# Patient Record
Sex: Female | Born: 1970 | Race: White | Hispanic: No | Marital: Married | Smoking: Never smoker
Health system: Southern US, Community
[De-identification: ages and names within clinical notes are randomized; demographics above are authoritative.]

## PROBLEM LIST (undated history)

## (undated) DIAGNOSIS — R51 Headache: Secondary | ICD-10-CM

## (undated) DIAGNOSIS — D497 Neoplasm of unspecified behavior of endocrine glands and other parts of nervous system: Secondary | ICD-10-CM

## (undated) DIAGNOSIS — M5430 Sciatica, unspecified side: Secondary | ICD-10-CM

## (undated) DIAGNOSIS — G500 Trigeminal neuralgia: Secondary | ICD-10-CM

## (undated) HISTORY — PX: CHOLECYSTECTOMY: SHX55

## (undated) HISTORY — DX: Headache: R51

## (undated) HISTORY — DX: Trigeminal neuralgia: G50.0

## (undated) HISTORY — DX: Neoplasm of unspecified behavior of endocrine glands and other parts of nervous system: D49.7

## (undated) HISTORY — DX: Sciatica, unspecified side: M54.30

---

## 1997-08-25 ENCOUNTER — Inpatient Hospital Stay (HOSPITAL_COMMUNITY): Admission: AD | Admit: 1997-08-25 | Discharge: 1997-08-25 | Payer: Self-pay | Admitting: Obstetrics

## 1997-08-26 ENCOUNTER — Encounter (HOSPITAL_COMMUNITY): Admission: RE | Admit: 1997-08-26 | Discharge: 1997-08-30 | Payer: Self-pay | Admitting: *Deleted

## 1997-08-29 ENCOUNTER — Inpatient Hospital Stay (HOSPITAL_COMMUNITY): Admission: AD | Admit: 1997-08-29 | Discharge: 1997-08-31 | Payer: Self-pay | Admitting: Obstetrics

## 1998-04-17 ENCOUNTER — Emergency Department (HOSPITAL_COMMUNITY): Admission: EM | Admit: 1998-04-17 | Discharge: 1998-04-17 | Payer: Self-pay | Admitting: Emergency Medicine

## 1998-08-24 ENCOUNTER — Encounter: Payer: Self-pay | Admitting: Emergency Medicine

## 1998-08-24 ENCOUNTER — Emergency Department (HOSPITAL_COMMUNITY): Admission: EM | Admit: 1998-08-24 | Discharge: 1998-08-24 | Payer: Self-pay | Admitting: Emergency Medicine

## 2000-01-21 ENCOUNTER — Other Ambulatory Visit: Admission: RE | Admit: 2000-01-21 | Discharge: 2000-01-21 | Payer: Self-pay | Admitting: *Deleted

## 2000-05-12 ENCOUNTER — Emergency Department (HOSPITAL_COMMUNITY): Admission: EM | Admit: 2000-05-12 | Discharge: 2000-05-12 | Payer: Self-pay | Admitting: *Deleted

## 2002-08-03 ENCOUNTER — Other Ambulatory Visit: Admission: RE | Admit: 2002-08-03 | Discharge: 2002-08-03 | Payer: Self-pay | Admitting: *Deleted

## 2004-03-20 ENCOUNTER — Other Ambulatory Visit: Admission: RE | Admit: 2004-03-20 | Discharge: 2004-03-20 | Payer: Self-pay | Admitting: *Deleted

## 2005-02-18 ENCOUNTER — Emergency Department (HOSPITAL_COMMUNITY): Admission: EM | Admit: 2005-02-18 | Discharge: 2005-02-18 | Payer: Self-pay | Admitting: Emergency Medicine

## 2005-03-26 ENCOUNTER — Other Ambulatory Visit: Admission: RE | Admit: 2005-03-26 | Discharge: 2005-03-26 | Payer: Self-pay | Admitting: *Deleted

## 2006-03-19 ENCOUNTER — Emergency Department (HOSPITAL_COMMUNITY): Admission: EM | Admit: 2006-03-19 | Discharge: 2006-03-19 | Payer: Self-pay | Admitting: Emergency Medicine

## 2006-06-30 ENCOUNTER — Emergency Department (HOSPITAL_COMMUNITY): Admission: EM | Admit: 2006-06-30 | Discharge: 2006-06-30 | Payer: Self-pay | Admitting: Emergency Medicine

## 2006-11-10 ENCOUNTER — Other Ambulatory Visit: Admission: RE | Admit: 2006-11-10 | Discharge: 2006-11-10 | Payer: Self-pay | Admitting: *Deleted

## 2007-04-23 HISTORY — PX: CHOLECYSTECTOMY: SHX55

## 2007-08-06 ENCOUNTER — Encounter (INDEPENDENT_AMBULATORY_CARE_PROVIDER_SITE_OTHER): Payer: Self-pay | Admitting: General Surgery

## 2007-08-06 ENCOUNTER — Ambulatory Visit (HOSPITAL_COMMUNITY): Admission: RE | Admit: 2007-08-06 | Discharge: 2007-08-06 | Payer: Self-pay | Admitting: General Surgery

## 2007-10-29 ENCOUNTER — Emergency Department (HOSPITAL_COMMUNITY): Admission: EM | Admit: 2007-10-29 | Discharge: 2007-10-29 | Payer: Self-pay | Admitting: Emergency Medicine

## 2008-01-27 ENCOUNTER — Other Ambulatory Visit: Admission: RE | Admit: 2008-01-27 | Discharge: 2008-01-27 | Payer: Self-pay | Admitting: Gynecology

## 2009-02-23 ENCOUNTER — Ambulatory Visit (HOSPITAL_COMMUNITY): Admission: RE | Admit: 2009-02-23 | Discharge: 2009-02-23 | Payer: Self-pay | Admitting: Obstetrics and Gynecology

## 2009-02-23 ENCOUNTER — Encounter (HOSPITAL_COMMUNITY): Payer: Self-pay | Admitting: Obstetrics and Gynecology

## 2009-08-15 ENCOUNTER — Emergency Department (HOSPITAL_COMMUNITY): Admission: EM | Admit: 2009-08-15 | Discharge: 2009-08-15 | Payer: Self-pay | Admitting: Emergency Medicine

## 2009-10-25 ENCOUNTER — Encounter: Admission: RE | Admit: 2009-10-25 | Discharge: 2009-10-25 | Payer: Self-pay | Admitting: Gynecology

## 2010-07-25 LAB — CBC
Hemoglobin: 13.2 g/dL (ref 12.0–15.0)
MCHC: 34.3 g/dL (ref 30.0–36.0)
Platelets: 251 10*3/uL (ref 150–400)
RDW: 12.8 % (ref 11.5–15.5)

## 2010-07-25 LAB — TYPE AND SCREEN
ABO/RH(D): O POS
Antibody Screen: NEGATIVE

## 2010-09-04 NOTE — Op Note (Signed)
NAME:  Tara Gardner, Tara Gardner               ACCOUNT NO.:  [id number]   MEDICAL RECORD NO.:  [id number]          PATIENT TYPE:  AMB   LOCATION:  DAY                          FACILITY:  [facility]   PHYSICIAN:  Adolph Pollack, M.D.DATE OF BIRTH:  1970/12/10   DATE OF PROCEDURE:  08/06/2007  DATE OF DISCHARGE:                               OPERATIVE REPORT   PREOPERATIVE DIAGNOSIS:  Symptomatic cholelithiasis.   POSTOPERATIVE DIAGNOSIS:  Symptomatic cholelithiasis.   PROCEDURE:  Laparoscopic cholecystectomy with operative cholangiogram.   SURGEON:  Adolph Pollack, M.D.   ASSISTANT:  Leonie Man, M.D.   ANESTHESIA:  General.   INDICATIONS:  This is a 40 year old female who has been having a fair  amount of left upper quadrant discomfort and was H. pylori positive.  She was treated for that and that improved.  Subsequent to that, she  been having postprandial right upper quadrant pressure-type pain and had  an ultrasound which demonstrated gallstones.  She now presents for  elective cholecystectomy for her symptomatic gallstones.  The procedure,  risks and aftercare were discussed with her preoperatively.   TECHNIQUE:  She was seen in the holding area and then brought to the  operating room and placed supine on the operating table and a general  anesthetic was administered.  The abdominal wall was sterilely prepped  and draped.  Marcaine solution was infiltrated in the subumbilical  region.  A previous subumbilical transverse incision was reincised and  carried down to the fascia, where the anterior and posterior fascia as  well as peritoneum were incised under direct vision, entering the  peritoneal cavity.  A pursestring suture of 0 Vicryl was placed around  the fascial edges.  A Hasson trocar was introduced into the peritoneal  cavity and pneumoperitoneum was created by insufflation of CO2 gas.   Next, the laparoscope was introduced.  She was then placed in reversed  Trendelenburg position, right side tilted slightly up.  An 11-mm trocars  was placed in the epigastric incision and two 5-mm trocars placed in the  right mid lateral abdomen.  The fundus of the gallbladder was grasped.  The gallbladder was not acutely inflamed.  The fundus was retracted  toward the right shoulder and the infundibulum was grasped and retracted  laterally.  Using blunt dissection close to the gallbladder, I mobilized  the infundibulum and identified the cystic duct.  A window was created  around the cystic duct.  A clip was placed to the cystic duct-  gallbladder junction.  A small incision was made in the cystic duct and  a cholangiocatheter was passed through the abdominal wall into the  cystic duct and a cholangiogram was performed.   Under real-time fluoroscopy, dilute contrast was injected into the  cystic duct.  The common hepatic, right and left hepatic, and common  bile ducts all filled promptly and contrast drained promptly into the  duodenum without obvious evidence of obstruction.  Final report is  pending the radiologist's interpretation.   The cholangiocatheter was then removed, the cystic duct was clipped 3  times on the staying -end side and  then divided.  I then identified the  anterior and posterior branches of the cystic artery and these were  clipped and divided.  I dissected the gallbladder free from the liver  using electrocautery and placed it in an Endopouch bag.   The gallbladder fossa was copiously irrigated and bleeding points were  controlled with electrocautery.  Upon further inspection, there was no  bleeding and no bile leak.   The gallbladder was then removed in the Endopouch bag through the  subumbilical port.  The subumbilical fascial defect was closed by  tightening up and tying down the pursestring suture.  Residual  irrigation fluid was evacuated.  The remaining trocars were removed and  a pneumoperitoneum was released.   Skin  incisions were then closed with 4-0 Monocryl subcuticular stitches  followed by Steri-Strips and sterile dressings.  She tolerated the  procedure well without any apparent complications and was taken to the  recovery room in satisfactory condition.      Adolph Pollack, M.D.  Electronically Signed     TJR/MEDQ  D:  08/06/2007  T:  08/06/2007  Job:  [id number]   cc:   Brett Canales A. Cleta Alberts, M.D.  Fax: [phone]

## 2011-01-15 LAB — COMPREHENSIVE METABOLIC PANEL
Albumin: 3.7
BUN: 11
Calcium: 8.9
Creatinine, Ser: 0.55
Total Protein: 6.6

## 2011-01-15 LAB — CBC
Hemoglobin: 13.3
MCHC: 33.9
Platelets: 224
RBC: 4.19
WBC: 6.5

## 2011-01-15 LAB — DIFFERENTIAL
Basophils Absolute: 0
Lymphocytes Relative: 37
Monocytes Absolute: 0.5
Monocytes Relative: 8
Neutro Abs: 3.5

## 2011-01-17 LAB — CBC
Hemoglobin: 13.8
MCHC: 33.5
MCV: 94.3
RBC: 4.38

## 2011-01-17 LAB — COMPREHENSIVE METABOLIC PANEL
ALT: 27
Alkaline Phosphatase: 64
CO2: 26
Calcium: 9.8
GFR calc non Af Amer: >60
Glucose, Bld: 97
Sodium: 137
Total Bilirubin: 0.6

## 2011-01-17 LAB — LIPASE, BLOOD: Lipase: 26

## 2011-01-17 LAB — DIFFERENTIAL
Basophils Absolute: 0.1
Basophils Relative: 1
Eosinophils Absolute: 0.1
Lymphs Abs: 3
Neutrophils Relative %: 52

## 2011-01-17 LAB — POCT CARDIAC MARKERS: Operator id: 244461

## 2013-11-15 ENCOUNTER — Other Ambulatory Visit: Payer: Self-pay | Admitting: Gynecology

## 2013-11-15 DIAGNOSIS — R928 Other abnormal and inconclusive findings on diagnostic imaging of breast: Secondary | ICD-10-CM

## 2013-11-19 ENCOUNTER — Ambulatory Visit
Admission: RE | Admit: 2013-11-19 | Discharge: 2013-11-19 | Disposition: A | Discharge status: Home / Self Care | Payer: BC Managed Care – PPO | Source: Ambulatory Visit | Attending: Gynecology | Admitting: Gynecology

## 2013-11-19 DIAGNOSIS — R928 Other abnormal and inconclusive findings on diagnostic imaging of breast: Secondary | ICD-10-CM

## 2014-12-27 ENCOUNTER — Other Ambulatory Visit: Payer: Self-pay | Admitting: Gynecology

## 2014-12-27 IMAGING — MG MM DIAGNOSTIC UNILATERAL R
2 series · 2 of 2 positions shown · non-contrast
Comparison: 11/08/2013 and 10/15/2012.

CLINICAL DATA: Recall from screening mammogram.

EXAM:
DIGITAL DIAGNOSTIC  right breast MAMMOGRAM WITH CAD

[R MLO]
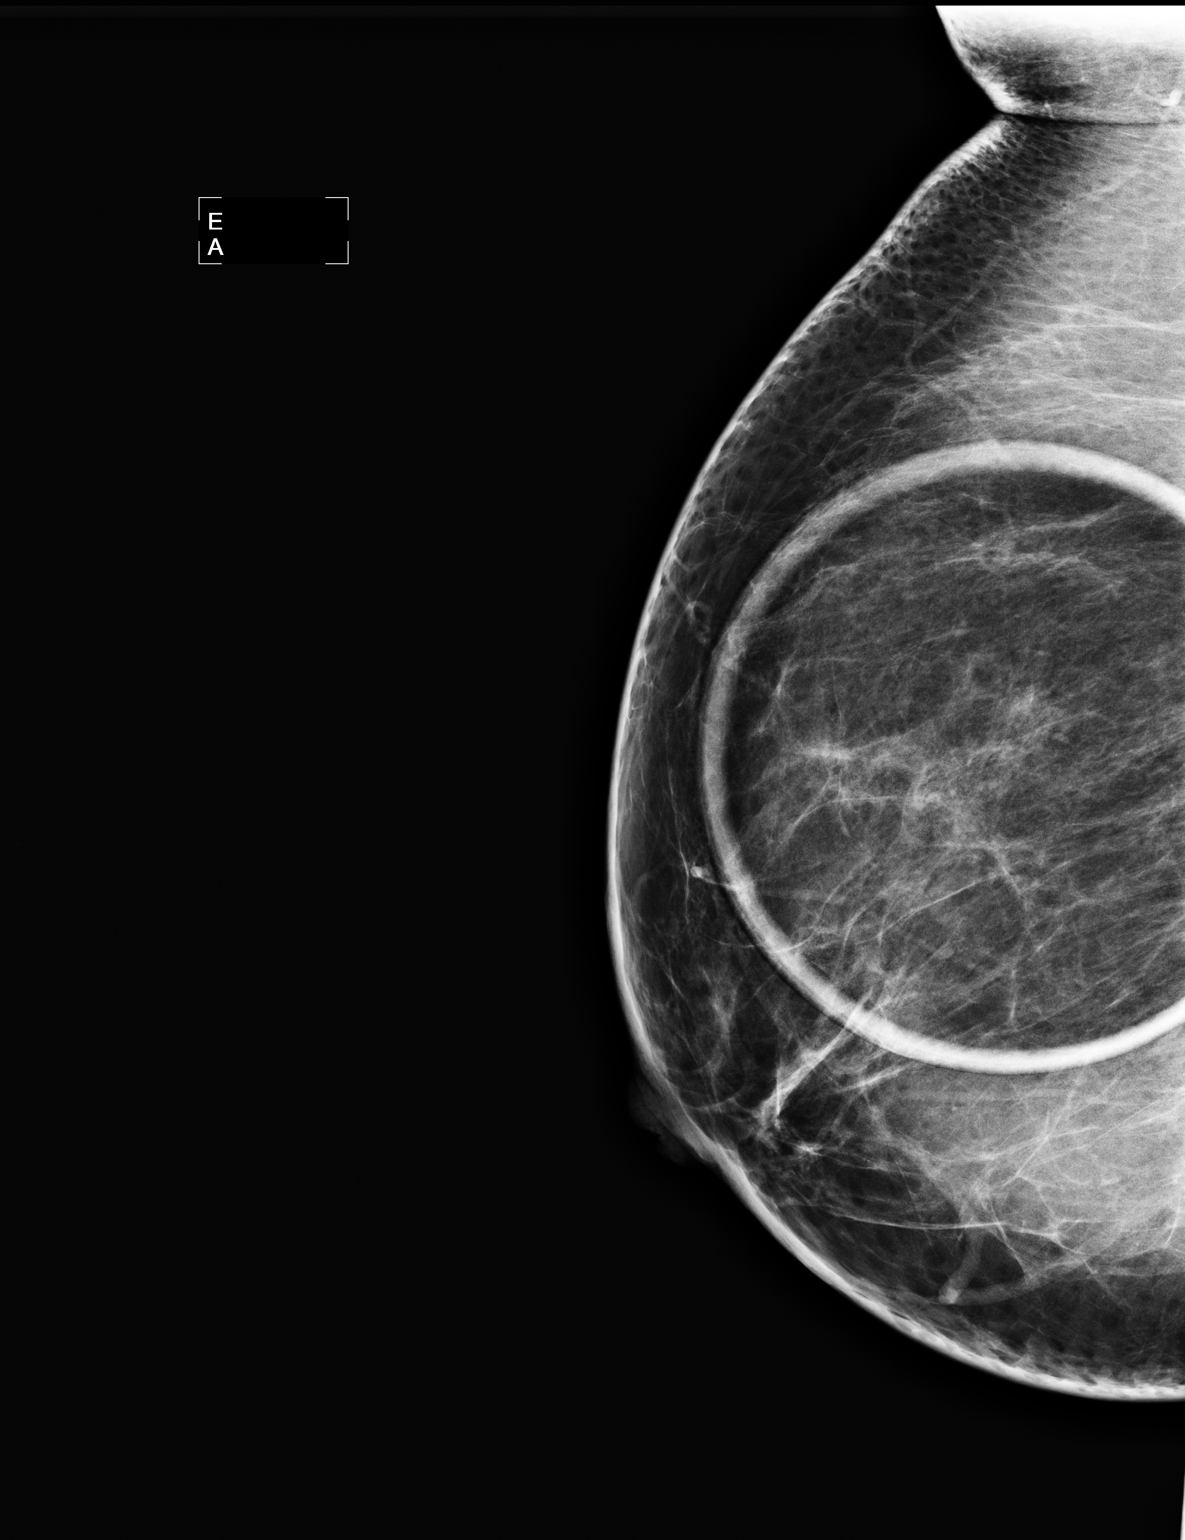

[R ML]
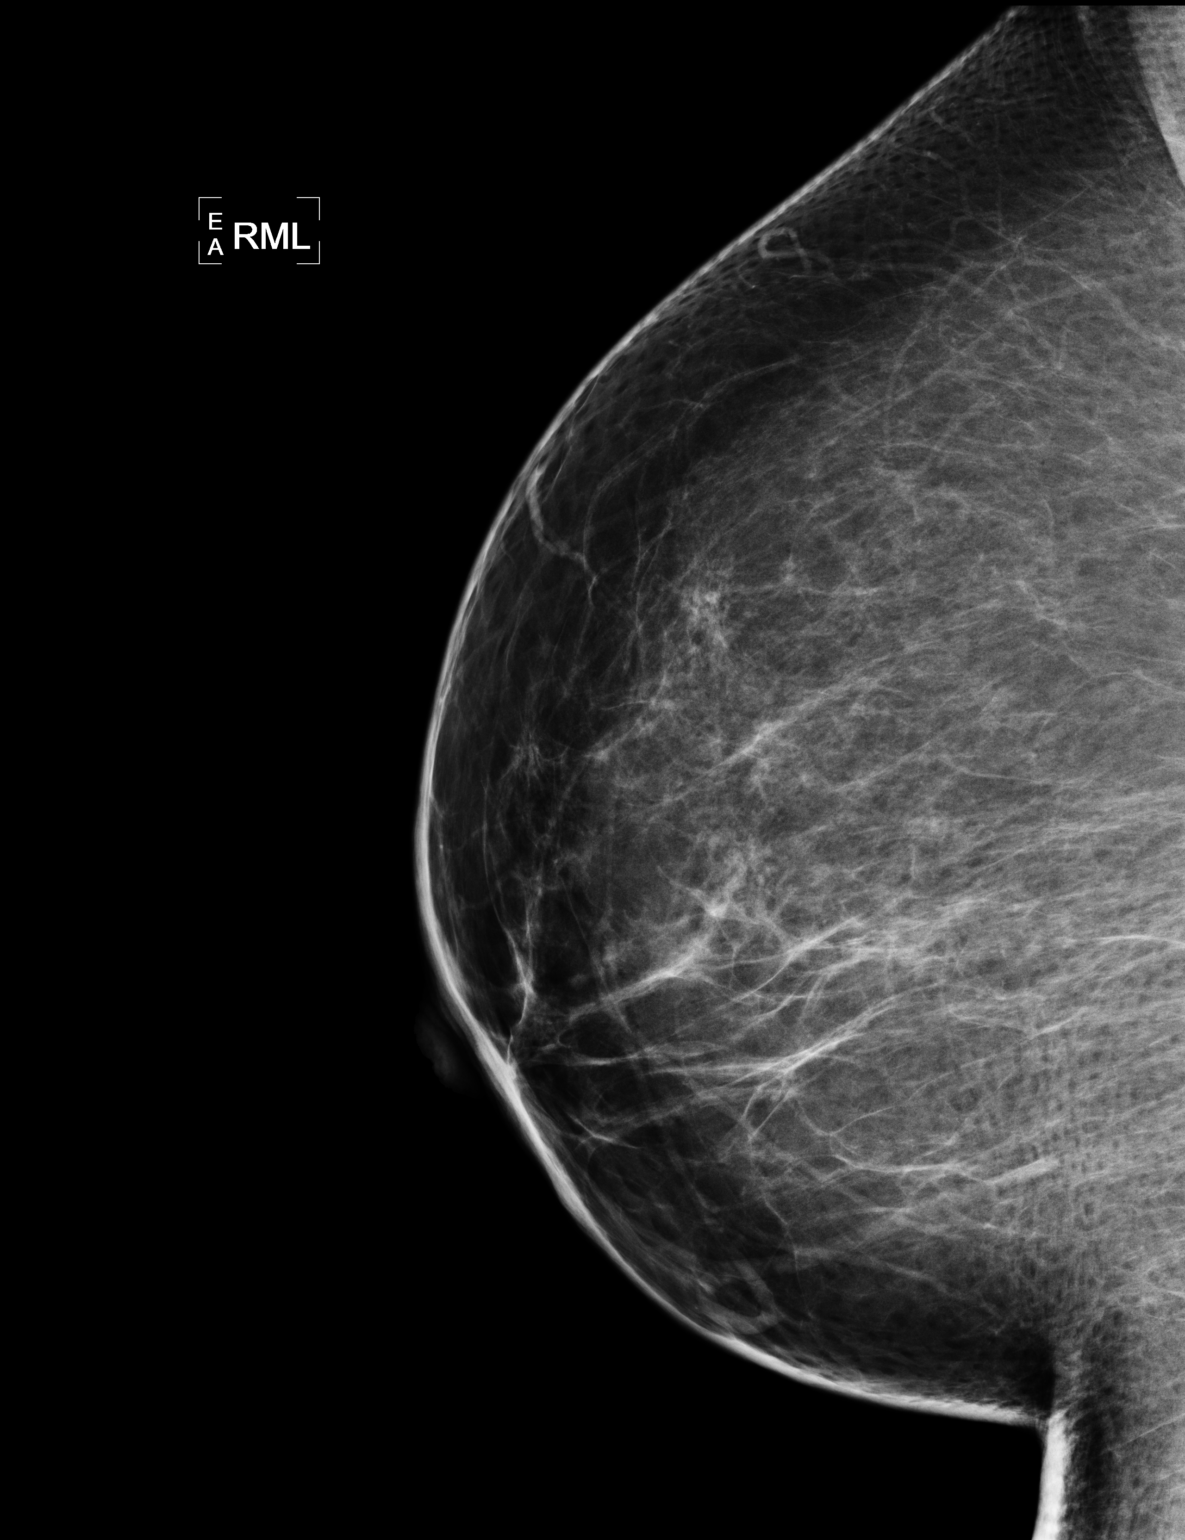

[2 of 2 positions shown; findings below may reference images not displayed]

ACR Breast Density Category b: There are scattered areas of
fibroglandular density.
FINDINGS: Additional views the right breast demonstrate no persistent
abnormality. The appearance is consistent with a summation shadow.

Mammographic images were processed with CAD.
IMPRESSION: No persistent abnormality on additional evaluation of the right
breast.

RECOMMENDATION:
Screening mammography in 1 year.

I have discussed the findings and recommendations with the patient.
Results were also provided in writing at the conclusion of the
visit. If applicable, a reminder letter will be sent to the patient
regarding the next appointment.

BI-RADS CATEGORY  1: Negative.

## 2014-12-28 LAB — CYTOLOGY - PAP

## 2016-11-18 ENCOUNTER — Encounter: Payer: Self-pay | Admitting: Neurology

## 2016-11-18 ENCOUNTER — Ambulatory Visit (INDEPENDENT_AMBULATORY_CARE_PROVIDER_SITE_OTHER): Payer: 59 | Admitting: Neurology

## 2016-11-18 DIAGNOSIS — G500 Trigeminal neuralgia: Secondary | ICD-10-CM

## 2016-11-18 MED ORDER — OXCARBAZEPINE 150 MG PO TABS: 150.0000 mg | ORAL_TABLET | Freq: Two times a day (BID) | ORAL | 11 refills | Status: DC

## 2016-11-18 MED ORDER — ALPRAZOLAM 0.5 MG PO TABS: 0.5000 mg | ORAL_TABLET | Freq: Every evening | ORAL | 0 refills | Status: DC | PRN

## 2016-11-18 NOTE — Progress Notes (Signed)
PATIENT: Tara Gardner DOB: 02/08/1971  Chief Complaint  Patient presents with  . Jaw Pain    Reports intermittent left-sided facial pain that started three years ago.  Historically, gabapentin has been helpful but recently the pain has become constant and severe, despite taking her medication.  Marland Kitchen PCP    Armanda Heritage, NP     HISTORICAL  [name] is a 46 years old right-handed female, seen in refer by  her primary care nurse practitioner Armanda Heritage, for evaluation of jaw pain, initial evaluation was on November 18 2016.  I reviewed and summarized the referring note she had a history of left jaw pain since 2015, initially she thought it was her left lower tooth disease, actually had left wisdom teeth pulled,followed by multiple dental examination, there was no significant abnormality found,  She described pain was a bruise sensation at the left jaw area, radiating to left ear, sharp radiating transient, she was treated with gabapentin 300 mg twice a day, which has helped her symptoms until January 2018, despite regular dose of gabapentin, she continue complains of recurrent left jaw pain, difficulty eating, skin sensitivity at the left upper and lower lips, sometimes triggered to become a headache,  She also reported history of pituitary tumor in 2009,she presented with frequent headache blurry vision , was seen by endocrinologist,no treatment was needed.   REVIEW OF SYSTEMS: Full 14 system review of systems performed and notable only for as above  ALLERGIES: Allergies  Allergen Reactions  . Penicillins Other (See Comments)    Childhood allergy - unsure of reaction.    HOME MEDICATIONS: Current Outpatient Prescriptions  Medication Sig Dispense Refill  . cyclobenzaprine (FLEXERIL) 10 MG tablet Take 10 mg by mouth 3 (three) times daily as needed for muscle spasms.    Marland Kitchen gabapentin (NEURONTIN) 300 MG capsule Take 300 mg by mouth 2 (two) times daily.     No current  facility-administered medications for this visit.     PAST MEDICAL HISTORY: Past Medical History:  Diagnosis Date  . Left facial pain   . Pituitary tumor   . Sciatica     PAST SURGICAL HISTORY: Past Surgical History:  Procedure Laterality Date  . CESAREAN SECTION     x 1  . CHOLECYSTECTOMY      FAMILY HISTORY: Family History  Problem Relation Age of Onset  . Hypertension Mother   . Diabetes Mother   . Anxiety disorder Mother   . Heart attack Father 6    SOCIAL HISTORY:  Social History   Social History  . Marital status: Married    Spouse name: N/A  . Number of children: 4  . Years of education: Some college   Occupational History  . [employer] - collection specialist    Social History Main Topics  . Smoking status: Never Smoker  . Smokeless tobacco: Never Used  . Alcohol use Yes     Comment: occasionally  . Drug use: No  . Sexual activity: Not on file   Other Topics Concern  . Not on file   Social History Narrative   Lives at home with her husband and children.   Right-handed.   Occasionally caffeine use.     PHYSICAL EXAM   Vitals:   11/18/16 1452  BP: 126/80  Pulse: 67  Weight: 277 lb 12 oz (126 kg)  Height: 5\' 6"  (1.676 m)    Not recorded      Body mass index is 44.83 kg/m.  PHYSICAL EXAMNIATION:  Gen: NAD, conversant, well nourised, obese, well groomed                     Cardiovascular: Regular rate rhythm, no peripheral edema, warm, nontender. Eyes: Conjunctivae clear without exudates or hemorrhage Neck: Supple, no carotid bruits. Pulmonary: Clear to auscultation bilaterally   NEUROLOGICAL EXAM:  MENTAL STATUS: Speech:    Speech is normal; fluent and spontaneous with normal comprehension.  Cognition:     Orientation to time, place and person     Normal recent and remote memory     Normal Attention span and concentration     Normal Language, naming, repeating,spontaneous speech     Fund of knowledge   CRANIAL  NERVES: CN II: Visual fields are full to confrontation. Fundoscopic exam is normal with sharp discs and no vascular changes. Pupils are round equal and briskly reactive to light. CN III, IV, VI: extraocular movement are normal. No ptosis. CN V: Facial sensation is intact to pinprick in all 3 divisions bilaterally. Corneal responses are intact.  CN VII: Face is symmetric with normal eye closure and smile. CN VIII: Hearing is normal to rubbing fingers CN IX, X: Palate elevates symmetrically. Phonation is normal. CN XI: Head turning and shoulder shrug are intact CN XII: Tongue is midline with normal movements and no atrophy.  MOTOR: There is no pronator drift of out-stretched arms. Muscle bulk and tone are normal. Muscle strength is normal.  REFLEXES: Reflexes are 2+ and symmetric at the biceps, triceps, knees, and ankles. Plantar responses are flexor.  SENSORY: Intact to light touch, pinprick, positional sensation and vibratory sensation are intact in fingers and toes.  COORDINATION: Rapid alternating movements and fine finger movements are intact. There is no dysmetria on finger-to-nose and heel-knee-shin.    GAIT/STANCE: Posture is normal. Gait is steady with normal steps, base, arm swing, and turning. Heel and toe walking are normal. Tandem gait is normal.  Romberg is absent.   DIAGNOSTIC DATA (LABS, IMAGING, TESTING) - I reviewed patient records, labs, notes, testing and imaging myself where available.   ASSESSMENT AND PLAN  Tara Gardner is a 46 y.o. female   Left trigeminal neuralgia,mainly involving left V3 branch  Laboratory evaluation  Complete evaluation with MRI of the brain with without contrast ' Try Trileptal 150 mg twice a day  Keep gabapentin 300 mg twice a day as needed    Marcial Pacas, M.D. Ph.D.  [facility] [address] [place], [place] [address] Ph: [phone] Fax: [phone]  CC:  Armanda Heritage, NP rovider

## 2016-11-19 LAB — COMPREHENSIVE METABOLIC PANEL
A/G RATIO: 1.7 (ref 1.2–2.2)
ALBUMIN: 4.1 g/dL (ref 3.5–5.5)
ALT: 13 IU/L (ref 0–32)
AST: 20 IU/L (ref 0–40)
Alkaline Phosphatase: 91 IU/L (ref 39–117)
BUN / CREAT RATIO: 16 (ref 9–23)
BUN: 12 mg/dL (ref 6–24)
CHLORIDE: 105 mmol/L (ref 96–106)
CO2: 22 mmol/L (ref 20–29)
Calcium: 9.3 mg/dL (ref 8.7–10.2)
Creatinine, Ser: 0.75 mg/dL (ref 0.57–1.00)
GFR, EST AFRICAN AMERICAN: 111 mL/min/{1.73_m2} (ref >59)
GFR, EST NON AFRICAN AMERICAN: 97 mL/min/{1.73_m2} (ref >59)
GLOBULIN, TOTAL: 2.4 g/dL (ref 1.5–4.5)
Glucose: 96 mg/dL (ref 65–99)
POTASSIUM: 4.3 mmol/L (ref 3.5–5.2)
SODIUM: 143 mmol/L (ref 134–144)
TOTAL PROTEIN: 6.5 g/dL (ref 6.0–8.5)

## 2016-11-19 LAB — CBC WITH DIFFERENTIAL
BASOS: 0 %
Basophils Absolute: 0 10*3/uL (ref 0.0–0.2)
EOS (ABSOLUTE): 0.1 10*3/uL (ref 0.0–0.4)
EOS: 2 %
HEMATOCRIT: 39 % (ref 34.0–46.6)
Hemoglobin: 12.7 g/dL (ref 11.1–15.9)
IMMATURE GRANS (ABS): 0 10*3/uL (ref 0.0–0.1)
Immature Granulocytes: 0 %
LYMPHS: 33 %
Lymphocytes Absolute: 2.4 10*3/uL (ref 0.7–3.1)
MCH: 30.3 pg (ref 26.6–33.0)
MCHC: 32.6 g/dL (ref 31.5–35.7)
MCV: 93 fL (ref 79–97)
Monocytes Absolute: 0.6 10*3/uL (ref 0.1–0.9)
Monocytes: 9 %
NEUTROS ABS: 4.2 10*3/uL (ref 1.4–7.0)
NEUTROS PCT: 56 %
RBC: 4.19 x10E6/uL (ref 3.77–5.28)
RDW: 14.1 % (ref 12.3–15.4)
WBC: 7.3 10*3/uL (ref 3.4–10.8)

## 2016-11-19 LAB — SEDIMENTATION RATE: Sed Rate: 5 mm/hr (ref 0–32)

## 2016-11-19 LAB — RPR: RPR Ser Ql: NONREACTIVE

## 2016-11-19 LAB — VITAMIN B12: Vitamin B-12: 333 pg/mL (ref 232–1245)

## 2016-11-19 LAB — C-REACTIVE PROTEIN: CRP: 2.5 mg/L (ref 0.0–4.9)

## 2016-11-19 LAB — HEMOGLOBIN A1C
ESTIMATED AVERAGE GLUCOSE: 114 mg/dL
Hgb A1c MFr Bld: 5.6 % (ref 4.8–5.6)

## 2016-11-19 LAB — TSH: TSH: 2.17 u[IU]/mL (ref 0.450–4.500)

## 2016-11-27 ENCOUNTER — Telehealth: Payer: Self-pay | Admitting: Neurology

## 2016-11-27 NOTE — Telephone Encounter (Signed)
Patient called and said you put her on oxcarbazepine 115 mg and it is helping during the day but is not helping at night. Please call her about medication. dg

## 2016-11-28 NOTE — Telephone Encounter (Signed)
Pt has returned the call to RN Sandy, she is asking for a call back °

## 2016-11-28 NOTE — Telephone Encounter (Signed)
Call her for detail, get her medication list, may increase trileptal to higher dose at night

## 2016-11-28 NOTE — Telephone Encounter (Signed)
LMVM for pt to return call.   

## 2016-11-28 NOTE — Telephone Encounter (Signed)
Returned call to pt,  LMVM to reach out tomorrow.

## 2016-11-29 NOTE — Telephone Encounter (Signed)
Called pt and she is to continue the trielptal 150mg  po bid and add the gabapentin at bedtime.  She has 300mg  caps. So will  try this and see how she does. (taking one capsule).  She appreciated call back.  [phone] was he direct line.

## 2016-11-29 NOTE — Telephone Encounter (Signed)
She may keep Trileptal 150 mg twice a day, at nighttime, she may add on gabapentin 100 mg tablets, 1-3 tablets for better pain control, and also avoid excessive drowsiness side effect

## 2016-11-29 NOTE — Telephone Encounter (Signed)
Spoke to pt.  She taking trileptal 150mg  po bid.  She states she thinks she is having the issue at night due to the position of her jaw, way she is sleeping.  Last 2 nights has been ok.  Thinking that a mouth piece maybe needed.  She is not sure though.  She is only taking tripletal bid and ( flexeril prn, xanax prn, has not taken recently).  She is not taking gabapentin.  Please advise.  Thanks .

## 2016-12-17 ENCOUNTER — Encounter: Payer: Self-pay | Admitting: *Deleted

## 2016-12-17 MED ORDER — PREGABALIN 50 MG PO CAPS
50.0000 mg | ORAL_CAPSULE | Freq: Three times a day (TID) | ORAL | 5 refills | Status: DC
Start: 2016-12-17 — End: 2017-01-23

## 2016-12-17 NOTE — Telephone Encounter (Signed)
Patient having severe stabbing nerve pain in her mouth at night. She is taking OXcarbazepine (TRILEPTAL) 150 MG tablet but it is not helping.

## 2016-12-17 NOTE — Telephone Encounter (Signed)
Left patient voicemail to return my call.

## 2016-12-17 NOTE — Telephone Encounter (Signed)
Spoke to patient - she is having increase left-sided facial pain.  She is currently taking Trileptal 150mg , BID and gabapentin 300mg , qhs.  She is unable to tolerate the daytime dose of gabapentin due to intolerable drowsiness.  Some days, she is okay during the day and the pain is more bothersome at night.  Per vo by Dr. Krista Blue, discontinue gabapentin and start Lyrica 50mg .  She may take 50mg , TID or 150mg , QHS, depending on the pattern of her pain.

## 2016-12-17 NOTE — Telephone Encounter (Signed)
Patient agreeable to Lyrica and verbalized understanding of dosing schedule.  Rx faxed to pharmacy.

## 2016-12-17 NOTE — Addendum Note (Signed)
Addended by: Noberto Retort C on: 12/17/2016 10:32 AM   Modules accepted: Orders

## 2017-01-06 ENCOUNTER — Ambulatory Visit: Payer: 59 | Admitting: Neurology

## 2017-01-20 ENCOUNTER — Telehealth: Payer: Self-pay | Admitting: Neurology

## 2017-01-20 NOTE — Telephone Encounter (Signed)
Patient calling stating OXcarbazepine (TRILEPTAL) 150 MG tablet is not helping with nerve pain. Can she increase dosage or what should she do. [facility] [place].

## 2017-01-20 NOTE — Telephone Encounter (Addendum)
Spoke to patient - she is currently taking Trileptal 150, BID and only 50mg  of Lyrica, QHS.  Dr. Krista Blue has provided her with Lyrica to take 50mg , TID or 150mg , QHS, depending on her pain pattern.  She will try the increased dose of Lyrica to see if it will resolve this flare up of pain.  She will call back, if problems persist.  I have spoke to patient about making an appt.  She is getting her finances straight and will call back to schedule.

## 2017-01-23 ENCOUNTER — Other Ambulatory Visit: Payer: Self-pay | Admitting: *Deleted

## 2017-01-23 MED ORDER — OXCARBAZEPINE 150 MG PO TABS: 150.0000 mg | ORAL_TABLET | Freq: Two times a day (BID) | ORAL | 3 refills | Status: DC

## 2017-01-23 MED ORDER — PREGABALIN 50 MG PO CAPS
50.0000 mg | ORAL_CAPSULE | Freq: Three times a day (TID) | ORAL | 1 refills | Status: DC
Start: 2017-01-23 — End: 2017-02-17

## 2017-01-23 NOTE — Telephone Encounter (Signed)
Spoke to patient - [facility] is [facility] to get [facility] today.  A new prescription has been sent to [facility] for her next refill.

## 2017-01-23 NOTE — Telephone Encounter (Signed)
Pt called she was [facility] by [facility] that OXcarbazepine (TRILEPTAL) 150 MG tablet has to come from [facility]. She has 4 left and is leaving on a flight tonight at 7 in [place]. Since going out of town she is wanting to know if it can be replaced with something else.

## 2017-02-14 NOTE — Telephone Encounter (Signed)
Spoke with pt.  She would like to stop Lyrica and try Gabapentin again.  Will confirm with YY if pt. needs to also stop Oxcarbazepine, and call pt. back.  Pt. aware call back may be on Monday, as YY is tied up with research today/fim

## 2017-02-14 NOTE — Telephone Encounter (Addendum)
Pt called the medication is not working. She is still having severe pain eating, talking. Please call her at [phone]

## 2017-02-14 NOTE — Telephone Encounter (Signed)
Please call patient, the options are higher dose of trileptal 150mg  2 tab bid, or high dose of gabapentin 300mg  up to 2 tabs tid.  Or switch Gabapentin to Lyrica 100mg  tid.

## 2017-02-17 MED ORDER — GABAPENTIN 300 MG PO CAPS: 300.0000 mg | ORAL_CAPSULE | Freq: Three times a day (TID) | ORAL | 3 refills | Status: DC

## 2017-02-17 NOTE — Telephone Encounter (Signed)
Spoke with Tara Gardner this am and per YY, confirmed that she may continue Trileptal.  She will stop Lyrica.  Rx. for Gabapentin 300mg  po tid [facility]fim

## 2017-02-17 NOTE — Addendum Note (Signed)
Addended by: France Ravens I on: 02/17/2017 08:35 AM   Modules accepted: Orders

## 2017-04-02 ENCOUNTER — Other Ambulatory Visit: Payer: Self-pay | Admitting: *Deleted

## 2017-04-02 MED ORDER — GABAPENTIN 300 MG PO CAPS
300.0000 mg | ORAL_CAPSULE | Freq: Three times a day (TID) | ORAL | 0 refills | Status: DC
Start: 2017-04-02 — End: 2017-07-25

## 2017-07-25 ENCOUNTER — Other Ambulatory Visit: Payer: Self-pay | Admitting: Neurology

## 2017-09-08 ENCOUNTER — Ambulatory Visit: Payer: 59 | Admitting: Neurology

## 2017-09-11 ENCOUNTER — Ambulatory Visit: Payer: 59 | Admitting: Neurology

## 2017-10-21 ENCOUNTER — Other Ambulatory Visit: Payer: Self-pay | Admitting: Neurology

## 2017-11-20 ENCOUNTER — Ambulatory Visit: Payer: 59 | Admitting: Neurology

## 2017-11-29 ENCOUNTER — Other Ambulatory Visit: Payer: Self-pay | Admitting: Neurology

## 2017-12-16 ENCOUNTER — Other Ambulatory Visit: Payer: Self-pay | Admitting: Neurology

## 2018-02-03 ENCOUNTER — Ambulatory Visit (INDEPENDENT_AMBULATORY_CARE_PROVIDER_SITE_OTHER): Payer: Managed Care, Other (non HMO) | Admitting: Neurology

## 2018-02-03 ENCOUNTER — Encounter

## 2018-02-03 ENCOUNTER — Encounter: Payer: Self-pay | Admitting: Neurology

## 2018-02-03 VITALS — BP 114/71 | HR 67 | Ht 66.5 in | Wt 282.0 lb

## 2018-02-03 DIAGNOSIS — G500 Trigeminal neuralgia: Secondary | ICD-10-CM

## 2018-02-03 MED ORDER — OXCARBAZEPINE ER 300 MG PO TB24: 300.0000 mg | ORAL_TABLET | Freq: Every day | ORAL | 4 refills | Status: DC

## 2018-02-03 NOTE — Progress Notes (Addendum)
PATIENT: Tara Gardner DOB: Aug 22, 1970  Chief Complaint  Patient presents with  . Follow-up    here alone for f/u for trigeminal neuralgia. hurts mostly at night. Wakes her up at night. Hard to get up in the mornings for her job. She wants to discuss FMLA.   Marland Kitchen Room 4     HISTORICAL  Tara Gardner is a 47 years old right-handed female, seen in refer by  her primary care nurse practitioner Armanda Heritage, for evaluation of jaw pain, initial evaluation was on November 18 2016.  I reviewed and summarized the referring note she had a history of left jaw pain since 2015, initially she thought it was her left lower tooth disease, actually had left wisdom teeth pulled,followed by multiple dental examination, there was no significant abnormality found,  She described pain was a bruise sensation at the left jaw area, radiating to left ear, sharp radiating transient, she was treated with gabapentin 300 mg twice a day, which has helped her symptoms until January 2018, despite regular dose of gabapentin, she continue complains of recurrent left jaw pain, difficulty eating, skin sensitivity at the left upper and lower lips, sometimes triggered to become a headache,  She also reported history of pituitary tumor in 2009,she presented with frequent headache blurry vision , was seen by endocrinologist,no treatment was needed.  UPDATE Feb 03 2018: She continues to have intermittent left facial pain, mostly at nighttime, despite taking Trileptal 150 mg twice a day, gabapentin 300 mg 3 times a day,  Couple night a month, she woke up at  night with left facial shooting pain, difficulty falling into sleep.   Lab in July 2018 showed normal or negative A1c 5.6, ESR, CRP, TSH, RPR, B12, CMP, CBC.    REVIEW OF SYSTEMS: Full 14 system review of systems performed and notable only for dizziness headache  ALLERGIES: Allergies  Allergen Reactions  . Penicillins Other (See Comments)    Childhood allergy - unsure  of reaction.    HOME MEDICATIONS: Current Outpatient Medications  Medication Sig Dispense Refill  . gabapentin (NEURONTIN) 300 MG capsule TAKE 1 CAPSULE BY MOUTH 3  TIMES DAILY 270 capsule 0  . OXcarbazepine (TRILEPTAL) 150 MG tablet Take 1 tablet (150 mg total) by mouth 2 (two) times daily. 180 tablet 3  . cyclobenzaprine (FLEXERIL) 10 MG tablet Take 10 mg by mouth 3 (three) times daily as needed for muscle spasms.     No current facility-administered medications for this visit.     PAST MEDICAL HISTORY: Past Medical History:  Diagnosis Date  . Left facial pain   . Pituitary tumor   . Sciatica     PAST SURGICAL HISTORY: Past Surgical History:  Procedure Laterality Date  . CESAREAN SECTION     x 1  . CHOLECYSTECTOMY      FAMILY HISTORY: Family History  Problem Relation Age of Onset  . Hypertension Mother   . Diabetes Mother   . Anxiety disorder Mother   . Heart attack Father 40    SOCIAL HISTORY:  Social History   Socioeconomic History  . Marital status: Married    Spouse name: Not on file  . Number of children: 4  . Years of education: Some college  . Highest education level: Not on file  Occupational History  . 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 Financial resource strain: Not on file  . Food insecurity:    Worry: Not on file  Inability: Not on file  . Transportation needs:    Medical: Not on file    Non-medical: Not on file  Tobacco Use  . Smoking status: Never Smoker  . Smokeless tobacco: Never Used  Substance and Sexual Activity  . Alcohol use: Yes    Comment: occasionally  . Drug use: No  . Sexual activity: Not on file  Lifestyle  . Physical activity:    Days per week: Not on file    Minutes per session: Not on file  . Stress: Not on file  Relationships  . Social connections:    Talks on phone: Not on file    Gets together: Not on file    Attends religious service: Not on file    Active member of club or  organization: Not on file    Attends meetings of clubs or organizations: Not on file    Relationship status: Not on file  . Intimate partner violence:    Fear of current or ex partner: Not on file    Emotionally abused: Not on file    Physically abused: Not on file    Forced sexual activity: Not on file  Other Topics Concern  . Not on file  Social History Narrative   Lives at home with her husband and children.   Right-handed.   Occasionally caffeine use.     PHYSICAL EXAM   Vitals:   02/03/18 0746  Weight: 282 lb (127.9 kg)  Height: 5' 6.5" (1.689 m)    Not recorded      Body mass index is 44.83 kg/m.  PHYSICAL EXAMNIATION:  Gen: NAD, conversant, well nourised, obese, well groomed                     Cardiovascular: Regular rate rhythm, no peripheral edema, warm, nontender. Eyes: Conjunctivae clear without exudates or hemorrhage Neck: Supple, no carotid bruits. Pulmonary: Clear to auscultation bilaterally   NEUROLOGICAL EXAM:  MENTAL STATUS: Speech:    Speech is normal; fluent and spontaneous with normal comprehension.  Cognition:     Orientation to time, place and person     Normal recent and remote memory     Normal Attention span and concentration     Normal Language, naming, repeating,spontaneous speech     Fund of knowledge   CRANIAL NERVES: CN II: Visual fields are full to confrontation. Fundoscopic exam is normal with sharp discs and no vascular changes. Pupils are round equal and briskly reactive to light. CN III, IV, VI: extraocular movement are normal. No ptosis. CN V: Facial sensation is intact to pinprick in all 3 divisions bilaterally. Corneal responses are intact.  CN VII: Face is symmetric with normal eye closure and smile. CN VIII: Hearing is normal to rubbing fingers CN IX, X: Palate elevates symmetrically. Phonation is normal. CN XI: Head turning and shoulder shrug are intact CN XII: Tongue is midline with normal movements and no  atrophy.  MOTOR: There is no pronator drift of out-stretched arms. Muscle bulk and tone are normal. Muscle strength is normal.  REFLEXES: Reflexes are 2+ and symmetric at the biceps, triceps, knees, and ankles. Plantar responses are flexor.  SENSORY: Intact to light touch, pinprick, positional sensation and vibratory sensation are intact in fingers and toes.  COORDINATION: Rapid alternating movements and fine finger movements are intact. There is no dysmetria on finger-to-nose and heel-knee-shin.    GAIT/STANCE: Posture is normal. Gait is steady with normal steps, base, arm swing, and turning. Heel  and toe walking are normal. Tandem gait is normal.  Romberg is absent.   DIAGNOSTIC DATA (LABS, IMAGING, TESTING) - I reviewed patient records, labs, notes, testing and imaging myself where available.   ASSESSMENT AND PLAN  OMNI DUNSWORTH is a 47 y.o. female   Left trigeminal neuralgia,mainly involving left V3 branch  Laboratory evaluation including Trileptal gabapentin level,  Changed to Oxtellar xr 300 mg every night  Keep gabapentin 300 mg 3 times a day      Marcial Pacas, M.D. Ph.D.  [facility] [address] [place], [place] [address] Ph: [phone] Fax: [phone]  CC:  Armanda Heritage, NP rovider

## 2018-02-05 ENCOUNTER — Telehealth: Payer: Self-pay | Admitting: Neurology

## 2018-02-05 LAB — COMPREHENSIVE METABOLIC PANEL
A/G RATIO: 2 (ref 1.2–2.2)
ALT: 16 IU/L (ref 0–32)
AST: 17 IU/L (ref 0–40)
Albumin: 4.1 g/dL (ref 3.5–5.5)
Alkaline Phosphatase: 87 IU/L (ref 39–117)
BUN/Creatinine Ratio: 15 (ref 9–23)
BUN: 11 mg/dL (ref 6–24)
Bilirubin Total: <0.2 mg/dL (ref 0.0–1.2)
CALCIUM: 9 mg/dL (ref 8.7–10.2)
CO2: 24 mmol/L (ref 20–29)
Chloride: 102 mmol/L (ref 96–106)
Creatinine, Ser: 0.72 mg/dL (ref 0.57–1.00)
GFR calc Af Amer: 116 mL/min/{1.73_m2} (ref >59)
GFR, EST NON AFRICAN AMERICAN: 101 mL/min/{1.73_m2} (ref >59)
GLOBULIN, TOTAL: 2.1 g/dL (ref 1.5–4.5)
Glucose: 88 mg/dL (ref 65–99)
POTASSIUM: 4 mmol/L (ref 3.5–5.2)
SODIUM: 140 mmol/L (ref 134–144)
Total Protein: 6.2 g/dL (ref 6.0–8.5)

## 2018-02-05 LAB — CBC
HEMOGLOBIN: 12.2 g/dL (ref 11.1–15.9)
Hematocrit: 38.2 % (ref 34.0–46.6)
MCH: 29.5 pg (ref 26.6–33.0)
MCHC: 31.9 g/dL (ref 31.5–35.7)
MCV: 93 fL (ref 79–97)
Platelets: 295 10*3/uL (ref 150–450)
RBC: 4.13 x10E6/uL (ref 3.77–5.28)
RDW: 15.3 % (ref 12.3–15.4)
WBC: 6.2 10*3/uL (ref 3.4–10.8)

## 2018-02-05 LAB — 10-HYDROXYCARBAZEPINE: OXCARBAZEPINE SERPL-MCNC: 3 ug/mL — AB (ref 10–35)

## 2018-02-05 LAB — GABAPENTIN LEVEL: Gabapentin Lvl: 1.4 ug/mL — ABNORMAL LOW (ref 4.0–16.0)

## 2018-02-05 NOTE — Telephone Encounter (Signed)
Spoke to patient - she verbalized understanding of her results and states she will continue taking her medications, as prescribed.

## 2018-02-05 NOTE — Telephone Encounter (Signed)
Please call patient, laboratory evaluation on February 03, 2018 showed normal CBC CMP, low range of oxcarbazepine, and gabapentin  She should continue with the treatment plan discussed during her office visit

## 2018-02-06 ENCOUNTER — Telehealth: Payer: Self-pay | Admitting: *Deleted

## 2018-02-06 NOTE — Telephone Encounter (Signed)
Pt FMLA form on [employer].

## 2018-02-06 NOTE — Telephone Encounter (Signed)
Paperwork completed and returned to [place] in medical records.

## 2018-02-09 DIAGNOSIS — Z0289 Encounter for other administrative examinations: Secondary | ICD-10-CM

## 2018-03-11 ENCOUNTER — Other Ambulatory Visit: Payer: Self-pay | Admitting: Neurology

## 2018-05-17 ENCOUNTER — Other Ambulatory Visit: Payer: Self-pay | Admitting: Neurology

## 2018-05-18 ENCOUNTER — Other Ambulatory Visit: Payer: Self-pay | Admitting: *Deleted

## 2018-05-18 ENCOUNTER — Telehealth: Payer: Self-pay | Admitting: Neurology

## 2018-05-18 MED ORDER — OXCARBAZEPINE ER 300 MG PO TB24: 300.0000 mg | ORAL_TABLET | Freq: Every day | ORAL | 3 refills | Status: DC

## 2018-05-18 NOTE — Telephone Encounter (Signed)
Refills sent to the requested pharmacy. 

## 2018-05-18 NOTE — Telephone Encounter (Signed)
Pt has called for a refill on her OXcarbazepine ER (OXTELLAR XR) 300 MG TB24 [facility]

## 2018-06-01 NOTE — Telephone Encounter (Signed)
I also called the patient and left detailed voicemail (ok per DPR) letting her know the medication has now been approved.

## 2018-06-01 NOTE — Telephone Encounter (Signed)
Pt was advised her insurance does not cover this medication anymore. Is there another medication that could be prescribed. Please call to advise

## 2018-06-01 NOTE — Telephone Encounter (Signed)
I called [facility] ([phone]) and completed verbal PA for oxcarbazepine ER 300mg .  Pt [id number].  PA approved through 06/02/2019.  Case VO#[id number].

## 2018-06-04 ENCOUNTER — Other Ambulatory Visit: Payer: Self-pay | Admitting: Neurology

## 2018-06-16 ENCOUNTER — Telehealth: Payer: Self-pay | Admitting: Neurology

## 2018-06-16 MED ORDER — OXCARBAZEPINE ER 300 MG PO TB24: 300.0000 mg | ORAL_TABLET | Freq: Every day | ORAL | 3 refills | Status: DC

## 2018-06-16 MED ORDER — GABAPENTIN 300 MG PO CAPS: 300.0000 mg | ORAL_CAPSULE | Freq: Three times a day (TID) | ORAL | 3 refills | Status: DC

## 2018-06-16 NOTE — Telephone Encounter (Signed)
Pt called in req Dr. Rhea Belton nurse give her a call back. She has questions about her medication.

## 2018-06-16 NOTE — Telephone Encounter (Signed)
Pt states that [facility] is giving her a hard time and she would like her medication called in to the [facility] on [address].

## 2018-06-16 NOTE — Telephone Encounter (Signed)
I called [facility].  I was informed, by Al, that the prior authorization is valid and that they have a current prescription on file.  Her co-pay for a 90-day supply is $313.00.  Oxtellar XR has a $0 co-pay assistance program.    I returned the call to the patient and had to leave a detailed message.  She first needs to apply for the Oxtellar XR co-pay card online.  She then needs to call [facility] mail order at [phone] to provide the savings card information.  They can also help her set up delivery at this same number.  I provided our call back number in case she has questions.

## 2018-06-16 NOTE — Telephone Encounter (Signed)
The patient returned by call.  I went over the information below.  She will apply for the co-pay card then contact [facility] for her medication.

## 2018-06-16 NOTE — Addendum Note (Signed)
Addended by: Desmond Lope on: 06/16/2018 05:41 PM   Modules accepted: Orders

## 2018-06-16 NOTE — Telephone Encounter (Signed)
Spoke to patient - states there is a problem with her oxcarbazepine prescription at [facility] [phone]). There was a recent prescription sent to them and a current PA is showing to be on file.  I will need to call them to see what is going on.

## 2018-06-16 NOTE — Telephone Encounter (Addendum)
Per patient's request, both Oxtellar XR and gabapentin prescriptions have been sent to [facility].  I called [facility] [phone]) and spoke to [facility] who canceled her prescriptions there.  I called the patient back and left her a message letting her know this request has been completed (ok per DPR).

## 2018-08-03 ENCOUNTER — Telehealth: Payer: Self-pay | Admitting: *Deleted

## 2018-08-03 NOTE — Telephone Encounter (Signed)
FMLA received and completed today for the patient.  The paperwork has been returned to medical records.

## 2018-10-15 ENCOUNTER — Telehealth: Payer: Self-pay | Admitting: Neurology

## 2018-10-15 ENCOUNTER — Other Ambulatory Visit: Payer: Self-pay | Admitting: *Deleted

## 2018-10-15 MED ORDER — GABAPENTIN 300 MG PO CAPS: 600.0000 mg | ORAL_CAPSULE | Freq: Three times a day (TID) | ORAL | 3 refills | Status: DC

## 2018-10-15 NOTE — Telephone Encounter (Signed)
Pt called and requested for RN to call her back to discuss her mouth pain and her medications gabapentin (NEURONTIN) 300 MG capsule and OXcarbazepine ER (OXTELLAR XR) 300 MG TB24. Please advise.

## 2018-10-15 NOTE — Telephone Encounter (Signed)
Last visit was in Oct 2019, she may take higher dose of gabapentin up to 300mg  2 tabs tid, and keep oxtellar 300mg  qhs,   We may set up a virtual visit with her for June 30.

## 2018-10-15 NOTE — Telephone Encounter (Signed)
I spoke to patient and she is agreeable to take the higher dose of gabapentin.  She inquired about being referred for gamma knife but hopes the medication will get her discomfort under good control.  She declined an appt at this time but will call back if symptoms worsen.

## 2018-10-15 NOTE — Telephone Encounter (Signed)
Reports flare-up of left-sided trigeminal neuralgia in the last 1-2 weeks.  She is having difficulty with eating and speaking.  She has continued taking gabapentin 300mg , one capsule TID and oxcarbazepine ER 300mg , one tablets QHS.  She is asking if any changes or adjustments can be made to her medication regimen.

## 2018-10-22 NOTE — Telephone Encounter (Signed)
She needs work place accommodations that when she has an acute flare up of trigeminal neuralgia, she has pain when talking on the phone.  During those times, she needs to be placed in mail so she does not have to make calls.  She will have her employer fax the form to our office.

## 2018-10-22 NOTE — Telephone Encounter (Signed)
Pt is calling in requesting a call back to discuss FMLA accommodations

## 2018-10-26 NOTE — Telephone Encounter (Signed)
Pt called requesting call back to further discuss.

## 2018-10-26 NOTE — Telephone Encounter (Signed)
She confirmed her medication doses in the earlier call.  I did reach out to her again in order to schedule an appt this week.

## 2018-10-26 NOTE — Telephone Encounter (Signed)
I have spoken with the patient.  She has scheduled an appt on 10/28/2018 to discuss her medications and work place accommodation needs.

## 2018-10-26 NOTE — Telephone Encounter (Signed)
Pt called in to report the increased gabapentin did not provide any benefit over the weekend. Pt states she tried the 300 mg 2 tab tid and pain was not resolved. Pt also states the pain is effecting her sleep now. Pt wanted to know if Dr. Krista Blue had any other recommendations? 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 Best call back # [phone]. Pt will reach out to her employer about the fax # for the letter.

## 2018-10-26 NOTE — Telephone Encounter (Signed)
Please check the dosage of her medications. Trileptal xr 300mg  qhs, gabapentin 300mg  2 tid?  Last visit was in Oct 2019.   If gabapentin 600mg  tid does not work, may change to Lyrica 100mg  tid, and increase trileptal to 300mg  bid.  Give her a follow up visit with me this week.

## 2018-10-26 NOTE — Telephone Encounter (Signed)
The patient states the recent increase in gabapentin and oxcarbazepine is not controlling her pain during this flare up of symptoms.  She is requesting additional adjustments to her treatment plan.  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

## 2018-10-28 ENCOUNTER — Encounter: Payer: Self-pay | Admitting: Neurology

## 2018-10-28 ENCOUNTER — Other Ambulatory Visit: Payer: Self-pay

## 2018-10-28 ENCOUNTER — Ambulatory Visit (INDEPENDENT_AMBULATORY_CARE_PROVIDER_SITE_OTHER): Payer: Managed Care, Other (non HMO) | Admitting: Neurology

## 2018-10-28 VITALS — BP 118/64 | HR 68 | Temp 98.2°F | Ht 66.5 in | Wt 286.5 lb

## 2018-10-28 DIAGNOSIS — G500 Trigeminal neuralgia: Secondary | ICD-10-CM

## 2018-10-28 MED ORDER — GABAPENTIN 300 MG PO CAPS: 900.0000 mg | ORAL_CAPSULE | Freq: Three times a day (TID) | ORAL | 11 refills | Status: DC

## 2018-10-28 MED ORDER — OXTELLAR XR 300 MG PO TB24: 300.0000 mg | ORAL_TABLET | Freq: Two times a day (BID) | ORAL | 4 refills | Status: DC

## 2018-10-28 NOTE — Patient Instructions (Signed)
Dr. Angelene Giovanni  Neurosurgery - [facility]  [address]  [place], [place] [address]   [phone] (toll-free) [phone]

## 2018-10-28 NOTE — Progress Notes (Signed)
PATIENT: Tara Gardner DOB: Apr 29, 1970  Chief Complaint  Patient presents with  . Trigeminal Neuralgia    She has been experiencing worsening of her left-sided facial pain.  Her recent increase of gabpentin 339m to two tablets TID has not helped her discomfort.  She has also continued to take Oxtellar XR 30101mat bedtime.  She is having difficulty doing her job speaking on the phones.  When she has her flare-ups, she would like a note for her employer to be placed in the mail department so she does not have to be on the phone talking.     HISTORICAL  NoINDRA WOLTERSs a 89ears old right-handed female, seen in refer by  her primary care nurse practitioner EdArmanda Heritagefor evaluation of jaw pain, initial evaluation was on November 18 2016.  I reviewed and summarized the referring note she had a history of left jaw pain since 2015, initially she thought it was her left lower tooth disease, actually had left wisdom teeth pulled,followed by multiple dental examination, there was no significant abnormality found,  She described pain was a bruise sensation at the left jaw area, radiating to left ear, sharp radiating transient, she was treated with gabapentin 300 mg twice a day, which has helped her symptoms until January 2018, despite regular dose of gabapentin, she continue complains of recurrent left jaw pain, difficulty eating, skin sensitivity at the left upper and lower lips, sometimes triggered to become a headache,  She also reported history of pituitary tumor in 2009,she presented with frequent headache blurry vision , was seen by endocrinologist,no treatment was needed.  UPDATE Feb 03 2018: She continues to have intermittent left facial pain, mostly at nighttime, despite taking Trileptal 150 mg twice a day, gabapentin 300 mg 3 times a day,  Couple night a month, she woke up at  night with left facial shooting pain, difficulty falling into sleep.   Lab in July 2018 showed normal or  negative A1c 5.6, ESR, CRP, TSH, RPR, B12, CMP, CBC.  UPDATE October 28 2018: Her left trigeminal pain was under good control from October 2019 to April 2020, then she began to have frequent flareup of radiating pain from left ear to left upper jaw, sharp transient, frequent occurrence, it is hard for her to talk on the phone, despite titrating dose of Trileptal XR 300 mg every night, gabapentin 300 mg 2 tablets 3 times a day.  She denies significant side effect from medications   Laboratory evaluation in October 2019: Gabapentin level was 1.4, normal CMP, CBC hemoglobin of 12.2, Trileptal level was 3   REVIEW OF SYSTEMS: Full 14 system review of systems performed and notable only for above ALLERGIES: Allergies  Allergen Reactions  . Penicillins Other (See Comments)    Childhood allergy - unsure of reaction.    HOME MEDICATIONS: Current Outpatient Medications  Medication Sig Dispense Refill  . cyclobenzaprine (FLEXERIL) 10 MG tablet Take 10 mg by mouth 3 (three) times daily as needed for muscle spasms.    . Marland Kitchenabapentin (NEURONTIN) 300 MG capsule Take 2 capsules (600 mg total) by mouth 3 (three) times daily. 540 capsule 3  . OXcarbazepine ER (OXTELLAR XR) 300 MG TB24 Take 300 mg by mouth at bedtime. 90 tablet 3   No current facility-administered medications for this visit.     PAST MEDICAL HISTORY: Past Medical History:  Diagnosis Date  . Left facial pain   . Pituitary tumor   . Sciatica  PAST SURGICAL HISTORY: Past Surgical History:  Procedure Laterality Date  . CESAREAN SECTION     x 1  . CHOLECYSTECTOMY      FAMILY HISTORY: Family History  Problem Relation Age of Onset  . Hypertension Mother   . Diabetes Mother   . Anxiety disorder Mother   . Heart attack Father 40    SOCIAL HISTORY:  Social History   Socioeconomic History  . Marital status: Married    Spouse name: Not on file  . Number of children: 4  . Years of education: Some college  . Highest education  level: Not on file  Occupational History  . 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 Financial resource strain: Not on file  . Food insecurity    Worry: Not on file    Inability: Not on file  . Transportation needs    Medical: Not on file    Non-medical: Not on file  Tobacco Use  . Smoking status: Never Smoker  . Smokeless tobacco: Never Used  Substance and Sexual Activity  . Alcohol use: Yes    Comment: occasionally  . Drug use: No  . Sexual activity: Not on file  Lifestyle  . Physical activity    Days per week: Not on file    Minutes per session: Not on file  . Stress: Not on file  Relationships  . Social Herbalist on phone: Not on file    Gets together: Not on file    Attends religious service: Not on file    Active member of club or organization: Not on file    Attends meetings of clubs or organizations: Not on file    Relationship status: Not on file  . Intimate partner violence    Fear of current or ex partner: Not on file    Emotionally abused: Not on file    Physically abused: Not on file    Forced sexual activity: Not on file  Other Topics Concern  . Not on file  Social History Narrative   Lives at home with her husband and children.   Right-handed.   Occasionally caffeine use.     PHYSICAL EXAM   Vitals:   10/28/18 1445  BP: 118/64  Pulse: 68  Temp: 98.2 F (36.8 C)  Weight: 286 lb 8 oz (130 kg)  Height: 5' 6.5" (1.689 m)    Not recorded      Body mass index is 45.55 kg/m.  PHYSICAL EXAMNIATION:  Gen: NAD, conversant, well nourised, obese, well groomed                     Cardiovascular: Regular rate rhythm, no peripheral edema, warm, nontender. Eyes: Conjunctivae clear without exudates or hemorrhage Neck: Supple, no carotid bruits. Pulmonary: Clear to auscultation bilaterally   NEUROLOGICAL EXAM:  MENTAL STATUS: Speech:    Speech is normal; fluent and spontaneous with normal comprehension.   Cognition:     Orientation to time, place and person     Normal recent and remote memory     Normal Attention span and concentration     Normal Language, naming, repeating,spontaneous speech     Fund of knowledge   CRANIAL NERVES: CN II: Visual fields are full to confrontation.  Pupils are round equal and briskly reactive to light. CN III, IV, VI: extraocular movement are normal. No ptosis. CN V: Facial sensation is intact to pinprick in all 3 divisions bilaterally. Corneal responses  are intact.  CN VII: Face is symmetric with normal eye closure and smile. CN VIII: Hearing is normal to rubbing fingers CN IX, X: Palate elevates symmetrically. Phonation is normal. CN XI: Head turning and shoulder shrug are intact CN XII: Tongue is midline with normal movements and no atrophy.  MOTOR: There is no pronator drift of out-stretched arms. Muscle bulk and tone are normal. Muscle strength is normal.  REFLEXES: Reflexes are 2+ and symmetric at the biceps, triceps, knees, and ankles. Plantar responses are flexor.  SENSORY: Intact to light touch, pinprick, positional sensation and vibratory sensation are intact in fingers and toes.  COORDINATION: Rapid alternating movements and fine finger movements are intact. There is no dysmetria on finger-to-nose and heel-knee-shin.    GAIT/STANCE: Posture is normal. Gait is steady with normal steps, base, arm swing, and turning.  DIAGNOSTIC DATA (LABS, IMAGING, TESTING) - I reviewed patient records, labs, notes, testing and imaging myself where available.   ASSESSMENT AND PLAN  JAQUISHA FRECH is a 48 y.o. female   Left trigeminal neuralgia,mainly involving left V3 branch   Increase Oxtellar xr 300 mg twice a day, gabapentin 300 mg 3 tablets 3 times a day   Refer her to to Angelene Giovanni at [facility] for gamma knife evaluation    Marcial Pacas, M.D. Ph.D.  [facility] [address] [place], [place] [address] Ph:  [phone] Fax: [phone]  CC:  Armanda Heritage, NP rovider

## 2018-11-05 NOTE — Telephone Encounter (Signed)
Paperwork received along with patient's signed consent form.  Completed, signed by Dr. Krista Blue, faxed and confirmed back to [facility] (ph: [phone], fax: [phone]).

## 2018-11-19 ENCOUNTER — Telehealth: Payer: Self-pay | Admitting: Neurology

## 2018-11-19 NOTE — Telephone Encounter (Signed)
Pt is asking if RN Sharyn Lull has already faxed the paperwork to her employer re: her accomodations

## 2018-11-19 NOTE — Telephone Encounter (Signed)
Her paperwork was faxed on 11/05/2018.  She would like for [place] to fax it again today.  This has been completed and confirmation received.

## 2018-11-25 NOTE — Telephone Encounter (Signed)
Pt called in and stated there was no signature on paperwork

## 2018-11-30 NOTE — Telephone Encounter (Signed)
Paperwork updated and returned to [place] in medical records.

## 2019-02-04 DIAGNOSIS — Z0289 Encounter for other administrative examinations: Secondary | ICD-10-CM

## 2019-02-08 ENCOUNTER — Telehealth: Payer: Self-pay | Admitting: *Deleted

## 2019-02-08 NOTE — Telephone Encounter (Signed)
Faxed to [employer] on 02/04/19 [phone]

## 2019-02-09 ENCOUNTER — Ambulatory Visit: Payer: Self-pay | Admitting: Neurology

## 2019-02-16 ENCOUNTER — Telehealth: Payer: Self-pay | Admitting: Neurology

## 2019-02-16 NOTE — Telephone Encounter (Signed)
Pt called wanting to speak to the RN about her FMLA paperwork. Please advise.

## 2019-02-16 NOTE — Telephone Encounter (Signed)
She wanted to know what date her FMLA paperwork was faxed from our office.  Per previous note, it was faxed on 02/08/2019.

## 2019-06-09 ENCOUNTER — Ambulatory Visit (INDEPENDENT_AMBULATORY_CARE_PROVIDER_SITE_OTHER): Payer: Managed Care, Other (non HMO) | Admitting: Primary Care

## 2019-06-09 ENCOUNTER — Encounter: Payer: Self-pay | Admitting: Primary Care

## 2019-06-09 ENCOUNTER — Other Ambulatory Visit: Payer: Self-pay

## 2019-06-09 DIAGNOSIS — R1032 Left lower quadrant pain: Secondary | ICD-10-CM

## 2019-06-09 DIAGNOSIS — R1900 Intra-abdominal and pelvic swelling, mass and lump, unspecified site: Secondary | ICD-10-CM | POA: Insufficient documentation

## 2019-06-09 DIAGNOSIS — K228 Other specified diseases of esophagus: Secondary | ICD-10-CM

## 2019-06-09 DIAGNOSIS — K2289 Other specified disease of esophagus: Secondary | ICD-10-CM

## 2019-06-09 DIAGNOSIS — R109 Unspecified abdominal pain: Secondary | ICD-10-CM | POA: Insufficient documentation

## 2019-06-09 DIAGNOSIS — R1901 Right upper quadrant abdominal swelling, mass and lump: Secondary | ICD-10-CM

## 2019-06-09 NOTE — Patient Instructions (Addendum)
Stop by the lab prior to leaving today. I will notify you of your results once received.   You will be contacted regarding your referral to GI and for your ultrasound.  Please let us know if you have not been contacted within two weeks.   It was a pleasure to meet you today! Please don't hesitate to call or message me with any questions. Welcome to [facility]!

## 2019-06-09 NOTE — Assessment & Plan Note (Addendum)
Mid esophagus/substernal chest when swallowing at times. Likely secondary to not chewing food thoroughly.  Given history of "narrow" esophagus years ago we will send to GI to evaluate.   Discussed to chew food thoroughly.

## 2019-06-09 NOTE — Progress Notes (Signed)
Subjective:    Patient ID: Tara Gardner, female    DOB: 02-11-1971, 49 y.o.   MRN: [id number]  HPI  This visit occurred during the SARS-CoV-2 public health emergency.  Safety protocols were in place, including screening questions prior to the visit, additional usage of staff PPE, and extensive cleaning of exam room while observing appropriate contact time as indicated for disinfecting solutions.   Tara Gardner is a 49 year old female who presents today to establish care and discuss the problems mentioned below. Will obtain/review records.  1) Trigeminal Neuralgia: Chronic left jaw pain that dates back to 2015, diagnosed with trigeminal neuralgia. Now following with neurology with last visit being July 2020. She is managed on gabapentin 600 mg TID and oxcarbazepine XR 300 mg. History of pituitary tumor in 2009, no intervention.  2) Chest Pressure/Choking: Located to the substernal chest, feels her food getting "stuck" in the upper to mid esophagus, sometimes chokes. The chest pressure occurs only when food feels lodged into the esophagus.   This began about four months ago. She thinks this is occurring as she's not chewing her food effectively due to left trigeminal neuralgia pain. She admits to eating very quickly. She doesn't have this issue with softer foods.   She underwent endoscopy for H pylori infection in 2009, was told at the time that her esophagus was narrow, did not undergo balloon dilation.   3) Abdominal Pain: Located to the left lower abdomen that began about four months ago. Pain occurs with and without eating for which she describes as sharp lasting a few minutes. She will massage the spot at times with improvement. She's also noticed a "lump" to the site of her cholecystomy from 2009, right upper quadrant at surgical site.   She denies nausea, bloating, constipation, diarrhea, blood in the stools. She has bowel movements daily. She admits to an unhealthy diet. She does not  notice a bulge. She exercises at the gym several days weekly.   BP Readings from Last 3 Encounters:  06/09/19 122/82  10/28/18 118/64  02/03/18 114/71     Review of Systems  HENT: Positive for trouble swallowing.   Gastrointestinal: Positive for abdominal pain.       Mid esophagus pressure/choking  Neurological:       Chronic left trigeminal neuralgia.        Past Medical History:  Diagnosis Date  . Pituitary tumor   . Sciatica   . Trigeminal neuralgia      Social History   Socioeconomic History  . Marital status: Married    Spouse name: Not on file  . Number of children: 4  . Years of education: Some college  . Highest education level: Not on file  Occupational History  . 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 Smoking status: Never Smoker  . Smokeless tobacco: Never Used  Substance and Sexual Activity  . Alcohol use: Yes    Comment: occasionally  . Drug use: No  . Sexual activity: Not on file  Other Topics Concern  . Not on file  Social History Narrative   Lives at home with her husband and children.   Right-handed.   Occasionally caffeine use.   Social Determinants of Health   Financial Resource Strain:   . Difficulty of Paying Living Expenses: Not on file  Food Insecurity:   . Worried About Charity fundraiser in the Last Year: Not on file  . Ran Out of Food in  the Last Year: Not on file  Transportation Needs:   . Lack of Transportation (Medical): Not on file  . Lack of Transportation (Non-Medical): Not on file  Physical Activity:   . Days of Exercise per Week: Not on file  . Minutes of Exercise per Session: Not on file  Stress:   . Feeling of Stress : Not on file  Social Connections:   . Frequency of Communication with Friends and Family: Not on file  . Frequency of Social Gatherings with Friends and Family: Not on file  . Attends Religious Services: Not on file  . Active Member of Clubs or Organizations: Not on file  .  Attends Archivist Meetings: Not on file  . Marital Status: Not on file  Intimate Partner Violence:   . Fear of Current or Ex-Partner: Not on file  . Emotionally Abused: Not on file  . Physically Abused: Not on file  . Sexually Abused: Not on file    Past Surgical History:  Procedure Laterality Date  . CESAREAN SECTION     x 1  . CHOLECYSTECTOMY  2009    Family History  Problem Relation Age of Onset  . Hypertension Mother   . Diabetes Mother   . Anxiety disorder Mother   . Heart attack Father 52    Allergies  Allergen Reactions  . Penicillins Other (See Comments)    Childhood allergy - unsure of reaction.    Current Outpatient Medications on File Prior to Visit  Medication Sig Dispense Refill  . gabapentin (NEURONTIN) 300 MG capsule Take 3 capsules (900 mg total) by mouth 3 (three) times daily. 270 capsule 11  . OXcarbazepine ER (OXTELLAR XR) 300 MG TB24 Take 300 mg by mouth 2 (two) times a day. 180 tablet 4   No current facility-administered medications on file prior to visit.    BP 122/82   Pulse 70   Temp (!) 97.1 F (36.2 C) (Temporal)   Ht 5\' 6"  (1.676 m)   Wt 289 lb 4 oz (131.2 kg)   LMP 05/16/2019   SpO2 97%   BMI 46.69 kg/m    Objective:   Physical Exam  Constitutional: She appears well-nourished.  Cardiovascular: Normal rate and regular rhythm.  Respiratory: Effort normal and breath sounds normal.  GI: Soft. Bowel sounds are normal. There is no abdominal tenderness.    Soft, non tender, immobile mass to right upper abdomen.   Musculoskeletal:     Cervical back: Neck supple.  Skin: Skin is warm and dry.  Psychiatric: She has a normal mood and affect.           Assessment & Plan:

## 2019-06-09 NOTE — Assessment & Plan Note (Signed)
Located to right upper quadrant, noticed on exam today.  Feels like cyst/lipoma. Could be scar tissue from cholecystectomy.   Ultrasound pending.

## 2019-06-09 NOTE — Assessment & Plan Note (Signed)
Chronic x 4 months, intermittent, no worse. Exam today benign.  Unclear etiology. No obvious evidence for hernia, doubt acute infection given duration.  Check labs today including CMP, CBC. Abdominal ultrasound pending.

## 2019-06-10 LAB — CBC WITH DIFFERENTIAL/PLATELET
Absolute Monocytes: 745 cells/uL (ref 200–950)
Basophils Absolute: 32 cells/uL (ref 0–200)
Basophils Relative: 0.4 %
Eosinophils Absolute: 89 cells/uL (ref 15–500)
Eosinophils Relative: 1.1 %
HCT: 37.4 % (ref 35.0–45.0)
Hemoglobin: 12.4 g/dL (ref 11.7–15.5)
Lymphs Abs: 2268 cells/uL (ref 850–3900)
MCH: 30.5 pg (ref 27.0–33.0)
MCHC: 33.2 g/dL (ref 32.0–36.0)
MCV: 92.1 fL (ref 80.0–100.0)
MPV: 10.6 fL (ref 7.5–12.5)
Monocytes Relative: 9.2 %
Neutro Abs: 4965 cells/uL (ref 1500–7800)
Neutrophils Relative %: 61.3 %
Platelets: 293 10*3/uL (ref 140–400)
RBC: 4.06 10*6/uL (ref 3.80–5.10)
RDW: 14 % (ref 11.0–15.0)
Total Lymphocyte: 28 %
WBC: 8.1 10*3/uL (ref 3.8–10.8)

## 2019-06-10 LAB — COMPREHENSIVE METABOLIC PANEL
AG Ratio: 1.9 (calc) (ref 1.0–2.5)
ALT: 13 U/L (ref 6–29)
AST: 14 U/L (ref 10–35)
Albumin: 4.2 g/dL (ref 3.6–5.1)
Alkaline phosphatase (APISO): 73 U/L (ref 31–125)
BUN: 9 mg/dL (ref 7–25)
CO2: 27 mmol/L (ref 20–32)
Calcium: 9.3 mg/dL (ref 8.6–10.2)
Chloride: 104 mmol/L (ref 98–110)
Creat: 0.63 mg/dL (ref 0.50–1.10)
Globulin: 2.2 g/dL (calc) (ref 1.9–3.7)
Glucose, Bld: 110 mg/dL — ABNORMAL HIGH (ref 65–99)
Potassium: 4 mmol/L (ref 3.5–5.3)
Sodium: 139 mmol/L (ref 135–146)
Total Bilirubin: 0.2 mg/dL (ref 0.2–1.2)
Total Protein: 6.4 g/dL (ref 6.1–8.1)

## 2019-06-16 ENCOUNTER — Encounter: Payer: Self-pay | Admitting: *Deleted

## 2019-06-21 ENCOUNTER — Telehealth: Payer: Self-pay | Admitting: Primary Care

## 2019-06-21 ENCOUNTER — Other Ambulatory Visit: Payer: Self-pay | Admitting: Primary Care

## 2019-06-21 ENCOUNTER — Ambulatory Visit
Admission: RE | Admit: 2019-06-21 | Discharge: 2019-06-21 | Disposition: A | Discharge status: Home / Self Care | Payer: Managed Care, Other (non HMO) | Source: Ambulatory Visit | Attending: Primary Care | Admitting: Primary Care

## 2019-06-21 ENCOUNTER — Encounter: Payer: Self-pay | Admitting: Family Medicine

## 2019-06-21 DIAGNOSIS — R1032 Left lower quadrant pain: Secondary | ICD-10-CM

## 2019-06-21 DIAGNOSIS — R1901 Right upper quadrant abdominal swelling, mass and lump: Secondary | ICD-10-CM

## 2019-06-21 NOTE — Telephone Encounter (Signed)
[facility] called regarding the order for the abdominal complete does not check all the all the area that provider indicated.   Patient was late for the appointment and then had to wait to get her ultrasound longer than expected. The tech stated that we can do the pelvis limited and abdominal limited but need to confirm with the provider.  Morey Hummingbird could not find a provider that was available at the time and gave me the call.  I told the tech that I cannot make that change since I am not the provider.  Since patient was upset, I have asked patient what she wanted to do and she stated lets check left lower since that is where her pain is at.

## 2019-06-21 NOTE — Telephone Encounter (Signed)
Noted, pt will get left side imaging including pelvic if needed

## 2019-06-22 NOTE — Telephone Encounter (Signed)
Noted. Will review result.

## 2019-07-02 ENCOUNTER — Telehealth: Payer: Self-pay

## 2019-07-02 NOTE — Telephone Encounter (Signed)
Referral closed.  See Referral Notes/hx.  Pt has not responded to phone calls or Unable to Contact letter.  

## 2019-07-03 NOTE — Telephone Encounter (Signed)
Noted  

## 2019-07-14 ENCOUNTER — Encounter: Payer: Self-pay | Admitting: Internal Medicine

## 2019-07-14 ENCOUNTER — Ambulatory Visit (INDEPENDENT_AMBULATORY_CARE_PROVIDER_SITE_OTHER): Payer: Managed Care, Other (non HMO) | Admitting: Internal Medicine

## 2019-07-14 DIAGNOSIS — M545 Low back pain, unspecified: Secondary | ICD-10-CM

## 2019-07-14 DIAGNOSIS — J301 Allergic rhinitis due to pollen: Secondary | ICD-10-CM

## 2019-07-14 DIAGNOSIS — J069 Acute upper respiratory infection, unspecified: Secondary | ICD-10-CM

## 2019-07-14 MED ORDER — CYCLOBENZAPRINE HCL 5 MG PO TABS: 5.0000 mg | ORAL_TABLET | Freq: Three times a day (TID) | ORAL | 0 refills | Status: DC | PRN

## 2019-07-14 MED ORDER — GUAIFENESIN-CODEINE 100-10 MG/5ML PO SOLN: 5.0000 mL | Freq: Three times a day (TID) | ORAL | 0 refills | Status: DC | PRN

## 2019-07-14 NOTE — Progress Notes (Signed)
Virtual Visit via Video Note  I connected with Tara Gardner on 07/14/19 at  2:00 PM EDT by a video enabled telemedicine application and verified that I am speaking with the correct person using two identifiers.  Location: Patient: Home Provider: Office   I discussed the limitations of evaluation and management by telemedicine and the availability of in person appointments. The patient expressed understanding and agreed to proceed.  History of Present Illness:  Pt reports headache, cough and body aches. This started 3 to 4 days ago. The headache is located in her forehead and in the back of her head. She describes the pain as pressure. She denies sensitivity to light, sound, dizziness or visual changes.  She has had some watery eyes but denies eye pain or.  The cough is mostly non productive. She denies wheezing or shortness of breat.  She denies runny nose, nasal congestion, ear pain, sore throat or loss of taste/smell.  She denies fever or chills does have low back pain.  She attributes this low back pain to sleeping on the floor while camping out with her children over the weekend.  She describes the pain as sore and achy.  The pain is worse with movement.  The pain does not radiate.  She denies numbness, tingling or weakness in her lower extremities.  She has tried ASA and Nyquil with some relief.  She has not had sick contacts diagnosed with Covid that she is aware of.   Past Medical History:  Diagnosis Date  . Pituitary tumor   . Sciatica   . Trigeminal neuralgia     Current Outpatient Medications  Medication Sig Dispense Refill  . gabapentin (NEURONTIN) 300 MG capsule Take 3 capsules (900 mg total) by mouth 3 (three) times daily. 270 capsule 11  . OXcarbazepine ER (OXTELLAR XR) 300 MG TB24 Take 300 mg by mouth 2 (two) times a day. 180 tablet 4   No current facility-administered medications for this visit.    Allergies  Allergen Reactions  . Penicillins Other (See Comments)    Childhood allergy - unsure of reaction.    Family History  Problem Relation Age of Onset  . Hypertension Mother   . Diabetes Mother   . Anxiety disorder Mother   . Heart attack Father 74    Social History   Socioeconomic History  . Marital status: Married    Spouse name: Not on file  . Number of children: 4  . Years of education: Some college  . Highest education level: Not on file  Occupational History  . 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 Smoking status: Never Smoker  . Smokeless tobacco: Never Used  Substance and Sexual Activity  . Alcohol use: Yes    Comment: occasionally  . Drug use: No  . Sexual activity: Not on file  Other Topics Concern  . Not on file  Social History Narrative   Lives at home with her husband and children.   Right-handed.   Occasionally caffeine use.   Social Determinants of Health   Financial Resource Strain:   . Difficulty of Paying Living Expenses:   Food Insecurity:   . Worried About Charity fundraiser in the Last Year:   . Arboriculturist in the Last Year:   Transportation Needs:   . Film/video editor (Medical):   Marland Kitchen Lack of Transportation (Non-Medical):   Physical Activity:   . Days of Exercise per Week:   . Minutes  of Exercise per Session:   Stress:   . Feeling of Stress :   Social Connections:   . Frequency of Communication with Friends and Family:   . Frequency of Social Gatherings with Friends and Family:   . Attends Religious Services:   . Active Member of Clubs or Organizations:   . Attends Archivist Meetings:   Marland Kitchen Marital Status:   Intimate Partner Violence:   . Fear of Current or Ex-Partner:   . Emotionally Abused:   Marland Kitchen Physically Abused:   . Sexually Abused:      Constitutional: Patient reports headache. Denies fever, malaise, fatigue, or abrupt weight changes.  HEENT: Denies eye pain, eye redness, ear pain, ringing in the ears, wax buildup, runny nose, nasal congestion,  bloody nose, or sore throat. Respiratory: Patient reports cough. Denies difficulty breathing, shortness of breath, or sputum production.   Cardiovascular: Denies chest pain, chest tightness, palpitations or swelling in the hands or feet.  Musculoskeletal: Patient reports body aches and low back pain.. Denies decrease in range of motion, difficulty with gait, or joint pain and swelling.  Skin: Denies redness, rashes, lesions or ulcercations.  Neurological: Denies dizziness, numbness, tingling, weakness or problems with balance and coordination.    No other specific complaints in a complete review of systems (except as listed in HPI above).  Observations/Objective:   Wt Readings from Last 3 Encounters:  06/09/19 289 lb 4 oz (131.2 kg)  10/28/18 286 lb 8 oz (130 kg)  02/03/18 282 lb (127.9 kg)    General: Appears her stated age, obese in NAD. HEENT: Head: normal shape and size; Nose: no congestion noted ; Throat/Mouth: she clears her throat frequently suggesting PND. Pulmonary/Chest: Normal effort. No respiratory distress.  Musculoskeletal: She points to bilateral low back as the site of her back pain. Neurological: Alert and oriented.   BMET    Component Value Date/Time   NA 139 06/09/2019 1534   NA 140 02/03/2018 0805   K 4.0 06/09/2019 1534   CL 104 06/09/2019 1534   CO2 27 06/09/2019 1534   GLUCOSE 110 (H) 06/09/2019 1534   BUN 9 06/09/2019 1534   BUN 11 02/03/2018 0805   CREATININE 0.63 06/09/2019 1534   CALCIUM 9.3 06/09/2019 1534   GFRNONAA 101 02/03/2018 0805   GFRAA 116 02/03/2018 0805    Lipid Panel  No results found for: CHOL, TRIG, HDL, CHOLHDL, VLDL, LDLCALC  CBC    Component Value Date/Time   WBC 8.1 06/09/2019 1534   RBC 4.06 06/09/2019 1534   HGB 12.4 06/09/2019 1534   HGB 12.2 02/03/2018 0805   HCT 37.4 06/09/2019 1534   HCT 38.2 02/03/2018 0805   PLT 293 06/09/2019 1534   PLT 295 02/03/2018 0805   MCV 92.1 06/09/2019 1534   MCV 93 02/03/2018  0805   MCH 30.5 06/09/2019 1534   MCHC 33.2 06/09/2019 1534   RDW 14.0 06/09/2019 1534   RDW 15.3 02/03/2018 0805   LYMPHSABS 2,268 06/09/2019 1534   LYMPHSABS 2.4 11/18/2016 1526   MONOABS 0.8 10/29/2007 0340   EOSABS 89 06/09/2019 1534   EOSABS 0.1 11/18/2016 1526   BASOSABS 32 06/09/2019 1534   BASOSABS 0.0 11/18/2016 1526    Hgb A1C Lab Results  Component Value Date   HGBA1C 5.6 11/18/2016       Assessment and Plan:  Allergic Rhinitis/Viral URI with Cough:   Advised her to start Zyrtec and Flonase OTC RX for Robitussin AC for cough  Acute Low  Back Pain:  Can take Ibuprofen 600 mg TID prn with food Heat and massage may be helpful RX for Flexeril 5 mg TID prn  Follow Up Instructions:    I discussed the assessment and treatment plan with the patient. The patient was provided an opportunity to ask questions and all were answered. The patient agreed with the plan and demonstrated an understanding of the instructions.   The patient was advised to call back or seek an in-person evaluation if the symptoms worsen or if the condition fails to improve as anticipated.    Webb Silversmith, NP

## 2019-07-14 NOTE — Patient Instructions (Signed)
Allergic Rhinitis, Adult Allergic rhinitis is a reaction to allergens in the air. Allergens are tiny specks (particles) in the air that cause your body to have an allergic reaction. This condition cannot be passed from person to person (is not contagious). Allergic rhinitis cannot be cured, but it can be controlled. There are two types of allergic rhinitis:  Seasonal. This type is also called hay fever. It happens only during certain times of the year.  Perennial. This type can happen at any time of the year. What are the causes? This condition may be caused by:  Pollen from grasses, trees, and weeds.  House dust mites.  Pet dander.  Mold. What are the signs or symptoms? Symptoms of this condition include:  Sneezing.  Runny or stuffy nose (nasal congestion).  A lot of mucus in the back of the throat (postnasal drip).  Itchy nose.  Tearing of the eyes.  Trouble sleeping.  Being sleepy during day. How is this treated? There is no cure for this condition. You should avoid things that trigger your symptoms (allergens). Treatment can help to relieve symptoms. This may include:  Medicines that block allergy symptoms, such as antihistamines. These may be given as a shot, nasal spray, or pill.  Shots that are given until your body becomes less sensitive to the allergen (desensitization).  Stronger medicines, if all other treatments have not worked. Follow these instructions at home: Avoiding allergens   Find out what you are allergic to. Common allergens include smoke, dust, and pollen.  Avoid them if you can. These are some of the things that you can do to avoid allergens: ? Replace carpet with wood, tile, or vinyl flooring. Carpet can trap dander and dust. ? Clean any mold found in the home. ? Do not smoke. Do not allow smoking in your home. ? Change your heating and air conditioning filter at least once a month. ? During allergy season:  Keep windows closed as much as  you can. If possible, use air conditioning when there is a lot of pollen in the air.  Use a special filter for allergies with your furnace and air conditioner.  Plan outdoor activities when pollen counts are lowest. This is usually during the early morning or evening hours.  If you do go outdoors when pollen count is high, wear a special mask for people with allergies.  When you come indoors, take a shower and change your clothes before sitting on furniture or bedding. General instructions  Do not use fans in your home.  Do not hang clothes outside to dry.  Wear sunglasses to keep pollen out of your eyes.  Wash your hands right away after you touch household pets.  Take over-the-counter and prescription medicines only as told by your doctor.  Keep all follow-up visits as told by your doctor. This is important. Contact a doctor if:  You have a fever.  You have a cough that does not go away (is persistent).  You start to make whistling sounds when you breathe (wheeze).  Your symptoms do not get better with treatment.  You have thick fluid coming from your nose.  You start to have nosebleeds. Get help right away if:  Your tongue or your lips are swollen.  You have trouble breathing.  You feel dizzy or you feel like you are going to pass out (faint).  You have cold sweats. Summary  Allergic rhinitis is a reaction to allergens in the air.  This condition may be   caused by allergens. These include pollen, dust mites, pet dander, and mold.  Symptoms include a runny, itchy nose, sneezing, or tearing eyes. You may also have trouble sleeping or feel sleepy during the day.  Treatment includes taking medicines and avoiding allergens. You may also get shots or take stronger medicines.  Get help if you have a fever or a cough that does not stop. Get help right away if you are short of breath. This information is not intended to replace advice given to you by your health care  provider. Make sure you discuss any questions you have with your health care provider. Document Revised: 07/28/2018 Document Reviewed: 10/28/2017 Elsevier Patient Education  2020 Elsevier Inc.  

## 2019-07-22 ENCOUNTER — Telehealth: Payer: Self-pay

## 2019-07-22 NOTE — Telephone Encounter (Signed)
Pt had mid dull come and go CP last night;no radiation of pain and lasted few mins each time with coughing episode; H/A pain level 8; SOB upon exertion especially when coughing episode. pts cough is dry and productive with clear phlegm. While pt is talking she is having coughing and followed by SOB. Pt is going to [facility] ED now;pt declined 911 and pt will have someone take pt to ED now. FYI to Gentry Fitz NP.

## 2019-07-22 NOTE — Telephone Encounter (Signed)
Noted  

## 2019-07-29 ENCOUNTER — Telehealth: Payer: Self-pay | Admitting: *Deleted

## 2019-07-29 NOTE — Telephone Encounter (Addendum)
PA for Oxtellar XR 300mg , one tablet BID started through covermymeds ([id number]). [id number] Pt has coverage w/ [employer] [phone]). Pt [id number]. Decision pending.

## 2019-07-29 NOTE — Telephone Encounter (Signed)
Outcome:  This medication or product was previously approved on [id number] from 06/01/2018 to 06/01/2020. You will be able to fill a prescription for this medication at your pharmacy. If your pharmacy has questions regarding the processing of your prescription, please have them call the [employer] pharmacy help desk at [phone].  I have notified [facility] of this update.

## 2019-08-24 ENCOUNTER — Telehealth: Payer: Self-pay | Admitting: *Deleted

## 2019-08-24 NOTE — Telephone Encounter (Addendum)
The patient was last seen 10/28/2018 with a request to follow up in three months. She has not had any further appointments at this time. I called the patient because we received FMLA paperwork to complete. We have no recent evaluation of her medical condition. She is seen for trigeminal neuralgia (prescribed oxcarbazepine and gabapentin). She is aware that an appt is required in order to continue prescribing medication and completing paperwork. She is now scheduled for 09/02/2019 with Tara Denmark, NP. The paperwork is pending an updated exam. It has been provided to Tara Comber, RN to hold until the patient's follow up. If she is unable to keep this appt, she understands that she must discuss future care with her PCP.

## 2019-09-01 NOTE — Progress Notes (Signed)
PATIENT: Tara Gardner DOB: 01/17/1971  REASON FOR VISIT: follow up HISTORY FROM: patient  HISTORY OF PRESENT ILLNESS: Today 09/02/19  HISTORY Tara Gardner is a 49 years old right-handed female, seen in refer by  her primary care nurse practitioner Tara Gardner, for evaluation of jaw pain, initial evaluation was on November 18 2016.  I reviewed and summarized the referring note she had a history of left jaw pain since 2015, initially she thought it was her left lower tooth disease, actually had left wisdom teeth pulled,followed by multiple dental examination, there was no significant abnormality found,  She described pain was a bruise sensation at the left jaw area, radiating to left ear, sharp radiating transient, she was treated with gabapentin 300 mg twice a day, which has helped her symptoms until January 2018, despite regular dose of gabapentin, she continue complains of recurrent left jaw pain, difficulty eating, skin sensitivity at the left upper and lower lips, sometimes triggered to become a headache,  She also reported history of pituitary tumor in 2009,she presented with frequent headache blurry vision , was seen by endocrinologist,no treatment was needed.  UPDATE Feb 03 2018: She continues to have intermittent left facial pain, mostly at nighttime, despite taking Trileptal 150 mg twice a day, gabapentin 300 mg 3 times a day,  Couple night a month, she woke up at  night with left facial shooting pain, difficulty falling into sleep.   Lab in July 2018 showed normal or negative A1c 5.6, ESR, CRP, TSH, RPR, B12, CMP, CBC.  UPDATE October 28 2018: Her left trigeminal pain was under good control from October 2019 to April 2020, then she began to have frequent flareup of radiating pain from left ear to left upper jaw, sharp transient, frequent occurrence, it is hard for her to talk on the phone, despite titrating dose of Trileptal XR 300 mg every night, gabapentin 300 mg 2  tablets 3 times a day.  She denies significant side effect from medications   Laboratory evaluation in October 2019: Gabapentin level was 1.4, normal CMP, CBC hemoglobin of 12.2, Trileptal level was 3   Update Sep 02, 2019 SS: Here with her husband for follow-up for left trigeminal neuralgia, when last seen, was referred to Dr. Salomon Gardner, had consultation, felt good candidate for MVD or gamma knife in August 2020, patient decided to hold off on procedure, decided she was going to follow-up a year later.  Currently her symptoms are fairly well controlled, she did have Covid the end of March, that triggered a flare.  Her symptoms come and go.  She self adjusts her dose of gabapentin, at most takes 900 mg 3 times a day, currently taking 300 mg daily, is taking Oxtellar XR 300 mg at bedtime (able to reduce from twice a day).  Works at [employer], working from home, talking on the telephone, needs FMLA in case she has a flare, and cannot talk on the telephone, in the last year says she missed maybe 2 or 3 days, if anything, she may go in late if she is up the night before with pain.  Has routine follow-up with primary provider, recent CBC, CMP look overall good. Was seeing PCP for abdominal pain issue that seems to have resolved.  REVIEW OF SYSTEMS: Out of a complete 14 system review of symptoms, the patient complains only of the following symptoms, and all other reviewed systems are negative.  Facial pain   ALLERGIES: Allergies  Allergen Reactions  .  Penicillins Other (See Comments)    Childhood allergy - unsure of reaction.    HOME MEDICATIONS: Outpatient Medications Prior to Visit  Medication Sig Dispense Refill  . cyclobenzaprine (FLEXERIL) 5 MG tablet Take 1 tablet (5 mg total) by mouth 3 (three) times daily as needed for muscle spasms. 10 tablet 0  . guaiFENesin-codeine 100-10 MG/5ML syrup Take 5 mLs by mouth 3 (three) times daily as needed for cough. 120 mL 0  . OXcarbazepine ER (OXTELLAR XR) 300  MG TB24 Take 300 mg by mouth 2 (two) times a day. 180 tablet 4  . gabapentin (NEURONTIN) 300 MG capsule Take 3 capsules (900 mg total) by mouth 3 (three) times daily. 270 capsule 11   No facility-administered medications prior to visit.    PAST MEDICAL HISTORY: Past Medical History:  Diagnosis Date  . Pituitary tumor   . Sciatica   . Trigeminal neuralgia     PAST SURGICAL HISTORY: Past Surgical History:  Procedure Laterality Date  . CESAREAN SECTION     x 1  . CHOLECYSTECTOMY  2009    FAMILY HISTORY: Family History  Problem Relation Age of Onset  . Hypertension Mother   . Diabetes Mother   . Anxiety disorder Mother   . Heart attack Father 27    SOCIAL HISTORY: Social History   Socioeconomic History  . Marital status: Married    Spouse name: Not on file  . Number of children: 4  . Years of education: Some college  . Highest education level: Not on file  Occupational History  . 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 Smoking status: Never Smoker  . Smokeless tobacco: Never Used  Substance and Sexual Activity  . Alcohol use: Yes    Comment: occasionally  . Drug use: No  . Sexual activity: Not on file  Other Topics Concern  . Not on file  Social History Narrative   Lives at home with her husband and children.   Right-handed.   Occasionally caffeine use.   Social Determinants of Health   Financial Resource Strain:   . Difficulty of Paying Living Expenses:   Food Insecurity:   . Worried About Charity fundraiser in the Last Year:   . Arboriculturist in the Last Year:   Transportation Needs:   . Film/video editor (Medical):   Marland Kitchen Lack of Transportation (Non-Medical):   Physical Activity:   . Days of Exercise per Week:   . Minutes of Exercise per Session:   Stress:   . Feeling of Stress :   Social Connections:   . Frequency of Communication with Friends and Family:   . Frequency of Social Gatherings with Friends and Family:   .  Attends Religious Services:   . Active Member of Clubs or Organizations:   . Attends Archivist Meetings:   Marland Kitchen Marital Status:   Intimate Partner Violence:   . Fear of Current or Ex-Partner:   . Emotionally Abused:   Marland Kitchen Physically Abused:   . Sexually Abused:    PHYSICAL EXAM  Vitals:   09/02/19 0827  BP: 112/66  Pulse: 74  Temp: (!) 97.5 F (36.4 C)  Weight: 286 lb (129.7 kg)  Height: _0  (1.676 m)   Body mass index is 46.16 kg/m.  Generalized: Well developed, in no acute distress   Neurological examination  Mentation: Alert oriented to time, place, history taking. Follows all commands speech and language fluent Cranial nerve II-XII: Pupils  were equal round reactive to light. Extraocular movements were full, visual field were full on confrontational test. Facial sensation and strength were normal. Head turning and shoulder shrug  were normal and symmetric. Motor: The motor testing reveals 5 over 5 strength of all 4 extremities. Good symmetric motor tone is noted throughout.  Sensory: Sensory testing is intact to soft touch on all 4 extremities. No evidence of extinction is noted.  Coordination: Cerebellar testing reveals good finger-nose-finger and heel-to-shin bilaterally.  Gait and station: Gait is normal. Reflexes: Deep tendon reflexes are symmetric and normal bilaterally.   DIAGNOSTIC DATA (LABS, IMAGING, TESTING) - I reviewed patient records, labs, notes, testing and imaging myself where available.  Lab Results  Component Value Date   WBC 8.1 06/09/2019   HGB 12.4 06/09/2019   HCT 37.4 06/09/2019   MCV 92.1 06/09/2019   PLT 293 06/09/2019      Component Value Date/Time   NA 139 06/09/2019 1534   NA 140 02/03/2018 0805   K 4.0 06/09/2019 1534   CL 104 06/09/2019 1534   CO2 27 06/09/2019 1534   GLUCOSE 110 (H) 06/09/2019 1534   BUN 9 06/09/2019 1534   BUN 11 02/03/2018 0805   CREATININE 0.63 06/09/2019 1534   CALCIUM 9.3 06/09/2019 1534   PROT  6.4 06/09/2019 1534   PROT 6.2 02/03/2018 0805   ALBUMIN 4.1 02/03/2018 0805   AST 14 06/09/2019 1534   ALT 13 06/09/2019 1534   ALKPHOS 87 02/03/2018 0805   BILITOT 0.2 06/09/2019 1534   BILITOT <0.2 02/03/2018 0805   GFRNONAA 101 02/03/2018 0805   GFRAA 116 02/03/2018 0805   No results found for: CHOL, HDL, LDLCALC, LDLDIRECT, TRIG, CHOLHDL Lab Results  Component Value Date   HGBA1C 5.6 11/18/2016   Lab Results  Component Value Date   VITAMINB12 333 11/18/2016   Lab Results  Component Value Date   TSH 2.170 11/18/2016      ASSESSMENT AND PLAN 49 y.o. year old female  has a past medical history of Pituitary tumor, Sciatica, and Trigeminal neuralgia. here with:  1.  Left trigeminal neuralgia, mainly involving right V3 branch -Overall, doing well, symptoms well controlled -Continue Oxtellar XR 300 mg twice daily (is actually only take once daily, not due for refill, if still doing well when due, will decrease to 300 mg daily) -Continue gabapentin 300 mg, 3 tablets 3 times a day (self adjusts dose, depending on pain level, currently on lower dose) -Has seen Dr. Salomon Gardner in August 2020, felt good candidate for MVD or gamma knife, has decided to hold off, is unsure about procedure -We will complete FMLA paperwork for the patient, as Dr. Krista Blue has done before -CBC, CMP reviewed from PCP -Follow-up in 1 year or sooner if needed  I spent 30 minutes of face-to-face and non-face-to-face time with patient.  This included previsit chart review, lab review, study review, order entry, electronic health record documentation, patient education.  Butler Denmark, AGNP-C, DNP 09/02/2019, 8:52 AM [facility] [address] [place], [place] [address] [phone]

## 2019-09-02 ENCOUNTER — Telehealth: Payer: Self-pay | Admitting: *Deleted

## 2019-09-02 ENCOUNTER — Encounter: Payer: Self-pay | Admitting: Neurology

## 2019-09-02 ENCOUNTER — Ambulatory Visit (INDEPENDENT_AMBULATORY_CARE_PROVIDER_SITE_OTHER): Payer: Managed Care, Other (non HMO) | Admitting: Neurology

## 2019-09-02 ENCOUNTER — Other Ambulatory Visit: Payer: Self-pay

## 2019-09-02 VITALS — BP 112/66 | HR 74 | Temp 97.5°F | Ht 66.0 in | Wt 286.0 lb

## 2019-09-02 DIAGNOSIS — G500 Trigeminal neuralgia: Secondary | ICD-10-CM | POA: Diagnosis not present

## 2019-09-02 MED ORDER — GABAPENTIN 300 MG PO CAPS: 900.0000 mg | ORAL_CAPSULE | Freq: Three times a day (TID) | ORAL | 1 refills | Status: DC

## 2019-09-02 NOTE — Progress Notes (Signed)
I have reviewed and agreed above plan. 

## 2019-09-02 NOTE — Patient Instructions (Signed)
Keep medications the same  See you back in 1 year

## 2019-09-02 NOTE — Telephone Encounter (Signed)
FMLA [employer] completed to SS/NP review and signature.

## 2019-09-02 NOTE — Telephone Encounter (Signed)
FMLA papers signed.

## 2019-09-02 NOTE — Telephone Encounter (Signed)
Pt form faxed to [employer] on 09/02/19

## 2019-09-02 NOTE — Telephone Encounter (Signed)
To MR. 

## 2019-09-13 NOTE — Telephone Encounter (Signed)
Hilda Blades do you have the FMLA papers still?

## 2019-09-13 NOTE — Telephone Encounter (Signed)
Pt called to advise her FMLA forms needs to know the hrs/time pt needs for an OV

## 2019-09-15 NOTE — Telephone Encounter (Signed)
FMLA form updated. Pt given 2 hours per office visit. Signed by Judson Roch NP and form sent to medical records for processing.

## 2019-11-08 ENCOUNTER — Telehealth: Payer: Self-pay | Admitting: Neurology

## 2019-11-08 NOTE — Telephone Encounter (Signed)
Pt is asking for a call QA:[id number] for Trigeminal neuralgia pain to Dr. in [place] for mouth pain.  Pt would like to discuss the recommendations made, pt states she is unsure who to reach out to re: the next plan of care for her

## 2019-11-09 NOTE — Telephone Encounter (Signed)
I called pt and she is wanting to proceed with possible surgery and wanted to get in touch with MD at [facility] (Dr. Vallarie Mare and Dr. Salomon Fick).  She feels like she wants to do gamma knife.  I gave her names and #'s to call.  She appreciated call back.

## 2019-12-03 ENCOUNTER — Telehealth: Payer: Self-pay | Admitting: Neurology

## 2019-12-03 NOTE — Telephone Encounter (Signed)
Pt is asking for a call from RN to discuss her mouth pain

## 2019-12-06 NOTE — Telephone Encounter (Signed)
I spoke to pt and she states that the mouth pain she has also shoots to top of head.  This is causing her migraine headaches /anxiety/ stress.  She is asking for this to be added to her FMLA.  I relayed that we have not see her for migraines/anxiety and due to SS/NP and Dr Krista Blue out of office for 1-2 wks, acutely she may need to see her pcp for acute treatment. I would send note to SS/NP and Dr. Krista Blue,  This being new problem may need to be addressed by Dr. Krista Blue.  Pt verbalized understanding.  She mentioned high copay for Dr Salomon Fick referral, (this may not be option her her at this time).

## 2019-12-13 NOTE — Telephone Encounter (Signed)
She has reported her TN pain triggers her to have a headache, with her work she talks on the phone. I am not sure what she is needing, we are giving her gabapentin, and oxtellar XR. If new issue, needs to be seen in office.

## 2019-12-14 NOTE — Telephone Encounter (Addendum)
I called pt and LMVM for her to return call.  She was to see her pcp re: migraines.  New problem for [place] would need referral from pcp, glad to see her if needed.

## 2020-02-01 ENCOUNTER — Telehealth: Payer: Self-pay | Admitting: Neurology

## 2020-02-01 NOTE — Telephone Encounter (Signed)
Called Pt to get $50 for leave paperwork and she stated she will call Debra directly. 02/01/20

## 2020-02-10 ENCOUNTER — Telehealth: Payer: Self-pay | Admitting: Primary Care

## 2020-02-10 DIAGNOSIS — K2289 Other specified disease of esophagus: Secondary | ICD-10-CM

## 2020-02-10 DIAGNOSIS — R1084 Generalized abdominal pain: Secondary | ICD-10-CM

## 2020-02-10 DIAGNOSIS — R1901 Right upper quadrant abdominal swelling, mass and lump: Secondary | ICD-10-CM

## 2020-02-10 NOTE — Telephone Encounter (Signed)
It looks like I saw her in February 2021 for reports of right upper quadrant abdominal mass (amongst other things). Is this what she is referring to? Has there been any change in the mass?  I am happy to order the upper abdominal ultrasound, where would she would like to go?

## 2020-02-10 NOTE — Telephone Encounter (Signed)
Pt called in due to she had an order to get the top of her stomach looked at and they did the bottom, and she is still having that issue and wanted to know if she can be sent somewhere else to be seen because they had her waiting for an almost 1hr and they couldn't find her order.

## 2020-02-11 NOTE — Telephone Encounter (Signed)
Patient states no change just continued issues with discomfort and eating. Would like to have order sent to [facility]. Let her know we would start order and they will call for appointment. If she has not heard from them by end of next week to let our office know.

## 2020-02-11 NOTE — Telephone Encounter (Signed)
Noted, ultrasound ordered.

## 2020-02-15 DIAGNOSIS — Z0289 Encounter for other administrative examinations: Secondary | ICD-10-CM

## 2020-02-17 NOTE — Telephone Encounter (Signed)
FMLA form completed to SS/NP for review signature.

## 2020-02-21 NOTE — Telephone Encounter (Signed)
Nurse called from [facility] for more clarification on the utlrasound . Best contact numbers are [phone] or [phone]

## 2020-02-21 NOTE — Telephone Encounter (Signed)
Based off of my notes from February 2021 I did feel the mass. Do I need to change to a US abdominal limited?

## 2020-02-21 NOTE — Telephone Encounter (Signed)
Called u/s back wanted to know if you had felt the RUQ mass or if patient did. If so we need to order U/s abdomen limited

## 2020-02-21 NOTE — Telephone Encounter (Signed)
Yes and let me know so I can call them and let know.

## 2020-02-22 ENCOUNTER — Ambulatory Visit
Admission: RE | Admit: 2020-02-22 | Discharge: 2020-02-22 | Disposition: A | Discharge status: Home / Self Care | Payer: Managed Care, Other (non HMO) | Source: Ambulatory Visit | Attending: Primary Care | Admitting: Primary Care

## 2020-02-22 ENCOUNTER — Other Ambulatory Visit: Payer: Self-pay

## 2020-02-22 DIAGNOSIS — K2289 Other specified disease of esophagus: Secondary | ICD-10-CM

## 2020-02-22 DIAGNOSIS — R1084 Generalized abdominal pain: Secondary | ICD-10-CM | POA: Diagnosis present

## 2020-02-22 DIAGNOSIS — R1901 Right upper quadrant abdominal swelling, mass and lump: Secondary | ICD-10-CM | POA: Diagnosis not present

## 2020-02-22 NOTE — Addendum Note (Signed)
Addended by: Pleas Koch on: 02/22/2020 11:47 AM   Modules accepted: Orders

## 2020-02-22 NOTE — Telephone Encounter (Signed)
Called and let them know order has been changed.

## 2020-02-22 NOTE — Telephone Encounter (Signed)
Order changed.

## 2020-08-02 ENCOUNTER — Other Ambulatory Visit: Payer: Self-pay | Admitting: Neurology

## 2020-08-14 ENCOUNTER — Telehealth: Payer: Self-pay | Admitting: Neurology

## 2020-08-14 MED ORDER — OXTELLAR XR 300 MG PO TB24: 300.0000 mg | ORAL_TABLET | Freq: Two times a day (BID) | ORAL | 0 refills | Status: DC

## 2020-08-14 NOTE — Telephone Encounter (Signed)
Pt has appt 08-2020, sent for 90day supply.

## 2020-08-14 NOTE — Telephone Encounter (Signed)
Pt is requesting a refill for OXcarbazepine ER (OXTELLAR XR) 300 MG TB24.  Pharmacy: [facility] [phone]

## 2020-08-14 NOTE — Addendum Note (Signed)
Addended by: Brandon Melnick on: 08/14/2020 04:58 PM   Modules accepted: Orders

## 2020-08-17 ENCOUNTER — Other Ambulatory Visit (HOSPITAL_COMMUNITY): Payer: Self-pay

## 2020-08-17 ENCOUNTER — Telehealth: Payer: Self-pay | Admitting: Neurology

## 2020-08-17 MED ORDER — OXTELLAR XR 300 MG PO TB24
300.0000 mg | ORAL_TABLET | Freq: Two times a day (BID) | ORAL | 0 refills | Status: DC
  Filled 2020-08-17: qty 90, 90d supply, fill #0

## 2020-08-17 NOTE — Addendum Note (Signed)
Addended by: Brandon Melnick on: 08/17/2020 11:33 AM   Modules accepted: Orders

## 2020-08-17 NOTE — Telephone Encounter (Signed)
I called pt and she has new job at [employer].  [facility].  Placed new order to [facility].  Gabapentin was done at [facility] using [facility] got for $5.  She will see what [facility] will do.  Will let us know if needing anything else.

## 2020-08-17 NOTE — Telephone Encounter (Signed)
Pt is asking for a call from RN to discuss changing her OXcarbazepine ER (OXTELLAR XR) 300 MG TB24 to something else due to cost in addition to changing her pharmacy because of new insurance.  Please call

## 2020-08-18 ENCOUNTER — Other Ambulatory Visit (HOSPITAL_COMMUNITY): Payer: Self-pay

## 2020-09-04 ENCOUNTER — Ambulatory Visit: Payer: Managed Care, Other (non HMO) | Admitting: Neurology

## 2020-10-09 ENCOUNTER — Other Ambulatory Visit (HOSPITAL_COMMUNITY): Payer: Self-pay

## 2020-10-09 ENCOUNTER — Telehealth: Payer: Self-pay | Admitting: Neurology

## 2020-10-09 MED ORDER — GABAPENTIN 300 MG PO CAPS
900.0000 mg | ORAL_CAPSULE | Freq: Three times a day (TID) | ORAL | 1 refills | Status: DC
  Filled 2020-10-09: qty 270, 30d supply, fill #0

## 2020-10-09 NOTE — Telephone Encounter (Addendum)
Confirmed dosage with patient. She is using [facility] now. Refill sent to avoid disruption to treatment.

## 2020-10-09 NOTE — Telephone Encounter (Signed)
Pt called requesting refill for her gabapentin (NEURONTIN) 300 MG capsule, pt scheduled for a virtual mychart visit on August 1st, 2022.

## 2020-10-14 DIAGNOSIS — H524 Presbyopia: Secondary | ICD-10-CM | POA: Diagnosis not present

## 2020-11-16 ENCOUNTER — Telehealth: Payer: Self-pay | Admitting: Neurology

## 2020-11-16 NOTE — Telephone Encounter (Signed)
Returned patient's call and assured her that her prescriptions would be refilled until she is seen again.  Patient denied further questions, verbalized understanding and expressed appreciation for the phone call.

## 2020-11-16 NOTE — Telephone Encounter (Signed)
Pt  wants to make sure she can get med refills before next apt with MD in Oct. She had a virtual apt  with Judson Roch on Monday that was resch. Best call back is [phone]

## 2020-11-20 ENCOUNTER — Telehealth: Payer: Managed Care, Other (non HMO) | Admitting: Neurology

## 2020-11-29 ENCOUNTER — Other Ambulatory Visit: Payer: Self-pay | Admitting: Neurology

## 2020-11-29 ENCOUNTER — Other Ambulatory Visit (HOSPITAL_COMMUNITY): Payer: Self-pay

## 2020-11-29 MED ORDER — GABAPENTIN 300 MG PO CAPS
900.0000 mg | ORAL_CAPSULE | Freq: Three times a day (TID) | ORAL | 2 refills | Status: DC
  Filled 2020-11-29: qty 270, 30d supply, fill #0

## 2020-11-29 MED ORDER — OXTELLAR XR 300 MG PO TB24
300.0000 mg | ORAL_TABLET | Freq: Two times a day (BID) | ORAL | 0 refills | Status: DC
  Filled 2020-11-29: qty 180, 90d supply, fill #0

## 2020-11-30 ENCOUNTER — Other Ambulatory Visit (HOSPITAL_COMMUNITY): Payer: Self-pay

## 2020-11-30 MED ORDER — OXTELLAR XR 300 MG PO TB24
300.0000 mg | ORAL_TABLET | Freq: Every day | ORAL | 0 refills | Status: DC
  Filled 2020-11-30: qty 90, 90d supply, fill #0

## 2020-12-01 ENCOUNTER — Other Ambulatory Visit (HOSPITAL_COMMUNITY): Payer: Self-pay

## 2020-12-15 DIAGNOSIS — Z510 Encounter for antineoplastic radiation therapy: Secondary | ICD-10-CM | POA: Diagnosis not present

## 2020-12-15 DIAGNOSIS — G500 Trigeminal neuralgia: Secondary | ICD-10-CM | POA: Diagnosis not present

## 2021-01-05 DIAGNOSIS — Z6841 Body Mass Index (BMI) 40.0 and over, adult: Secondary | ICD-10-CM | POA: Diagnosis not present

## 2021-01-05 DIAGNOSIS — Z1211 Encounter for screening for malignant neoplasm of colon: Secondary | ICD-10-CM | POA: Diagnosis not present

## 2021-01-05 DIAGNOSIS — Z1231 Encounter for screening mammogram for malignant neoplasm of breast: Secondary | ICD-10-CM | POA: Diagnosis not present

## 2021-01-05 DIAGNOSIS — Z01419 Encounter for gynecological examination (general) (routine) without abnormal findings: Secondary | ICD-10-CM | POA: Diagnosis not present

## 2021-01-19 ENCOUNTER — Telehealth: Payer: Self-pay | Admitting: Neurology

## 2021-01-19 NOTE — Telephone Encounter (Signed)
Pt called, would like a call from the nurse to discuss a sooner appt before January. Having some mouth pain need medication refill.

## 2021-01-22 ENCOUNTER — Encounter: Payer: Self-pay | Admitting: *Deleted

## 2021-01-22 NOTE — Telephone Encounter (Addendum)
Left message for patient to call our office. Also sent a mychart message.   When she calls back, we can offer a 15 minute mychart visit or RN for work-in slot for in-office visit.

## 2021-01-23 ENCOUNTER — Ambulatory Visit: Payer: Managed Care, Other (non HMO) | Admitting: Neurology

## 2021-01-29 DIAGNOSIS — Z13220 Encounter for screening for lipoid disorders: Secondary | ICD-10-CM | POA: Diagnosis not present

## 2021-01-29 DIAGNOSIS — Z131 Encounter for screening for diabetes mellitus: Secondary | ICD-10-CM | POA: Diagnosis not present

## 2021-02-15 DIAGNOSIS — E221 Hyperprolactinemia: Secondary | ICD-10-CM | POA: Diagnosis not present

## 2021-02-15 DIAGNOSIS — D443 Neoplasm of uncertain behavior of pituitary gland: Secondary | ICD-10-CM | POA: Diagnosis not present

## 2021-02-28 ENCOUNTER — Other Ambulatory Visit: Payer: Self-pay | Admitting: *Deleted

## 2021-02-28 ENCOUNTER — Other Ambulatory Visit (HOSPITAL_COMMUNITY): Payer: Self-pay

## 2021-02-28 MED ORDER — OXTELLAR XR 300 MG PO TB24: 300.0000 mg | ORAL_TABLET | Freq: Every day | ORAL | 0 refills | Status: DC

## 2021-02-28 MED ORDER — GABAPENTIN 300 MG PO CAPS: 900.0000 mg | ORAL_CAPSULE | Freq: Three times a day (TID) | ORAL | 0 refills | Status: DC

## 2021-02-28 MED ORDER — OXTELLAR XR 300 MG PO TB24
300.0000 mg | ORAL_TABLET | Freq: Every day | ORAL | 0 refills | Status: DC
  Filled 2021-02-28: qty 90, 90d supply, fill #0

## 2021-02-28 MED ORDER — GABAPENTIN 300 MG PO CAPS
900.0000 mg | ORAL_CAPSULE | Freq: Three times a day (TID) | ORAL | 0 refills | Status: DC
  Filled 2021-02-28: qty 270, 30d supply, fill #0

## 2021-03-01 ENCOUNTER — Ambulatory Visit: Payer: Managed Care, Other (non HMO) | Admitting: Neurology

## 2021-03-01 ENCOUNTER — Other Ambulatory Visit (HOSPITAL_COMMUNITY): Payer: Self-pay

## 2021-03-02 ENCOUNTER — Other Ambulatory Visit (HOSPITAL_COMMUNITY): Payer: Self-pay

## 2021-03-08 ENCOUNTER — Other Ambulatory Visit: Payer: Self-pay | Admitting: Endocrinology

## 2021-03-08 DIAGNOSIS — D443 Neoplasm of uncertain behavior of pituitary gland: Secondary | ICD-10-CM

## 2021-03-14 DIAGNOSIS — R42 Dizziness and giddiness: Secondary | ICD-10-CM | POA: Diagnosis not present

## 2021-03-14 DIAGNOSIS — R519 Headache, unspecified: Secondary | ICD-10-CM | POA: Diagnosis not present

## 2021-03-31 IMAGING — US US ABDOMEN COMPLETE
1 series · 13 of 25 positions shown · non-contrast
Comparison: Prior ultrasound from 05/20/2007.

CLINICAL DATA: Initial evaluation for generalized abdominal pain,
right upper quadrant abdominal mass. History of prior
cholecystectomy.

EXAM:
ABDOMEN ULTRASOUND COMPLETE

[Series 1: us abdomen complete · 0.28mm/px · 13 of 75 slices shown]
[im 1/75]
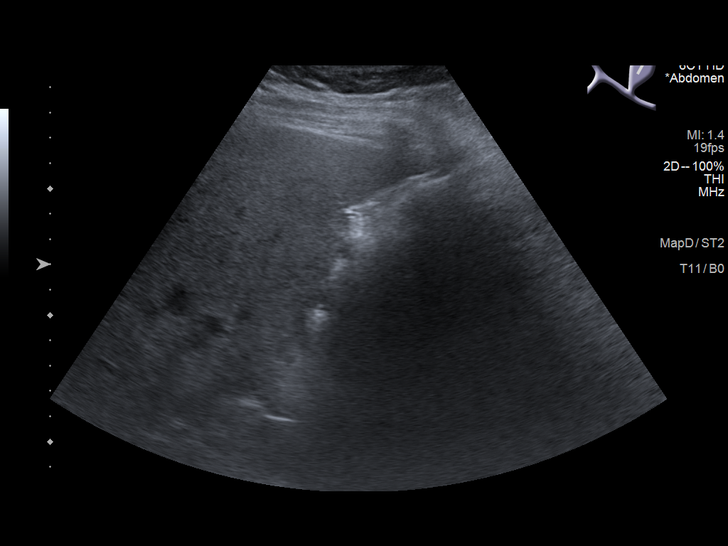
[im 7/75]
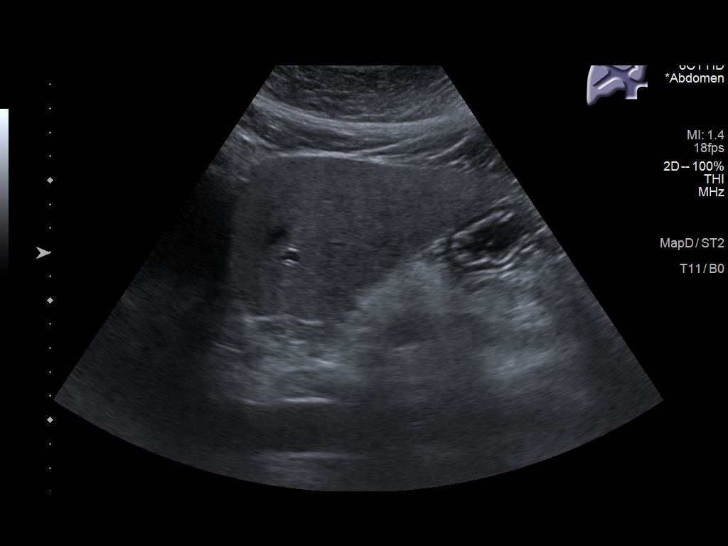
[im 13/75]
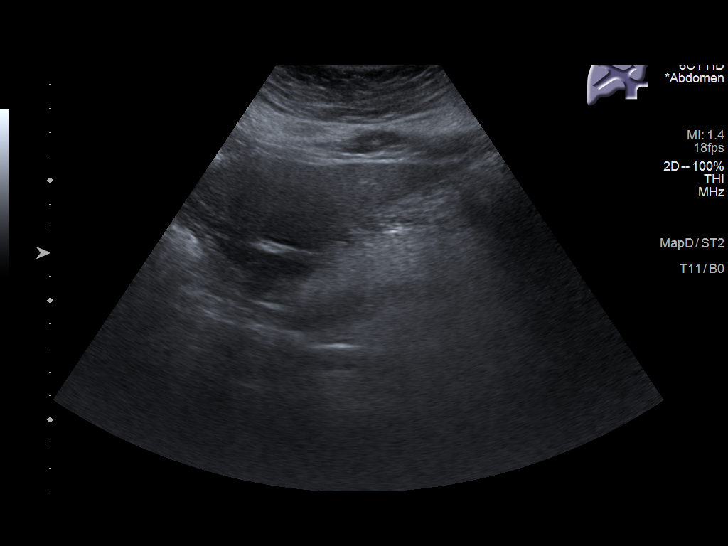
[im 19/75]
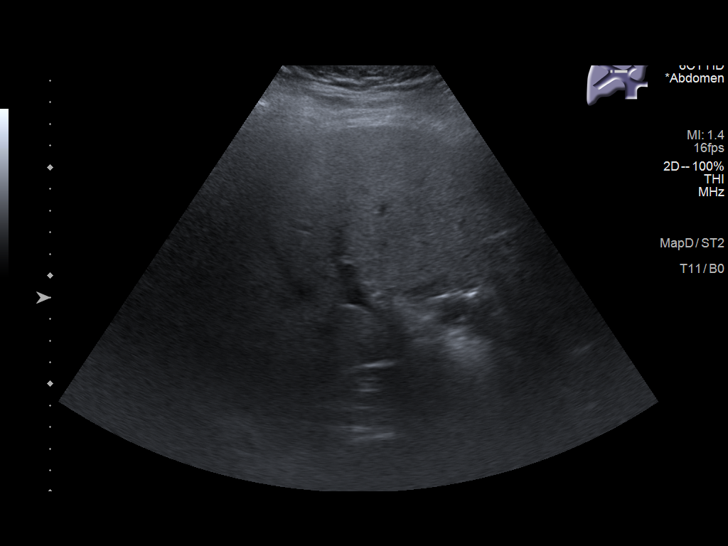
[im 25/75]
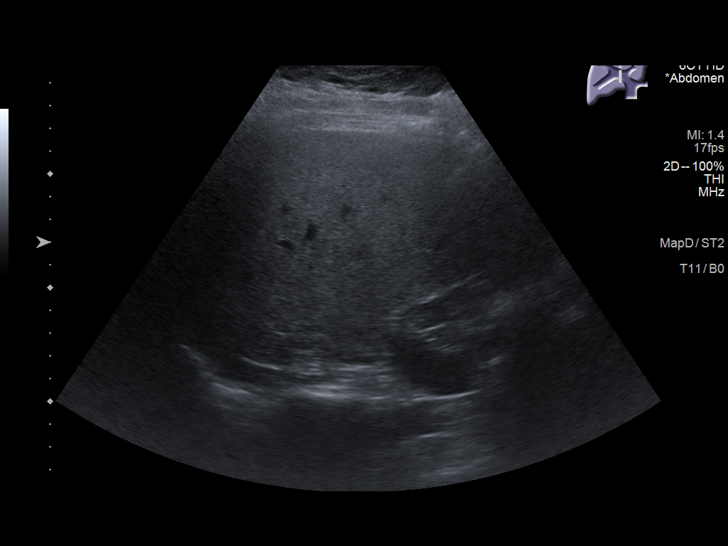
[im 31/75]
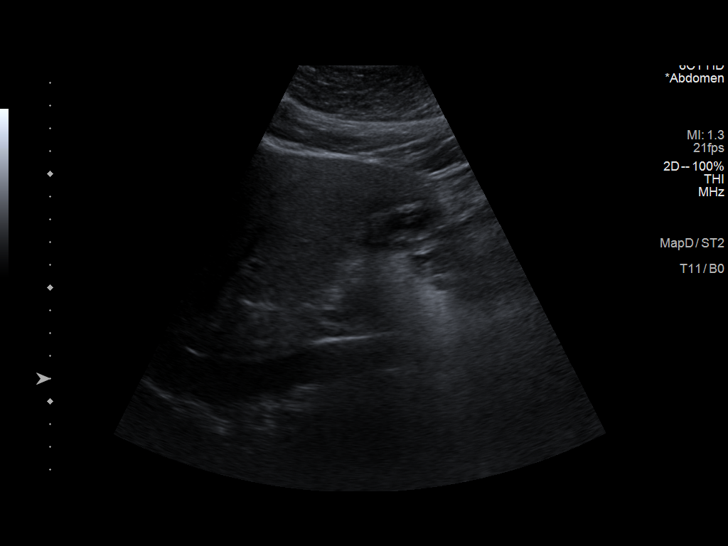
[im 38/75]
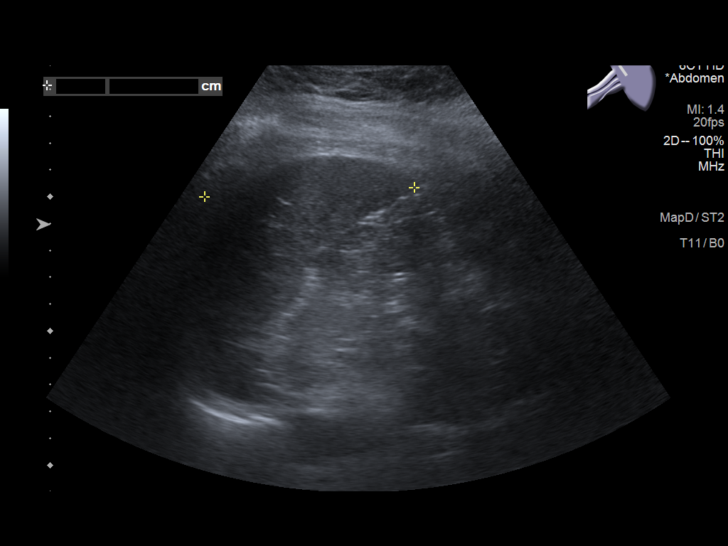
[im 44/75]
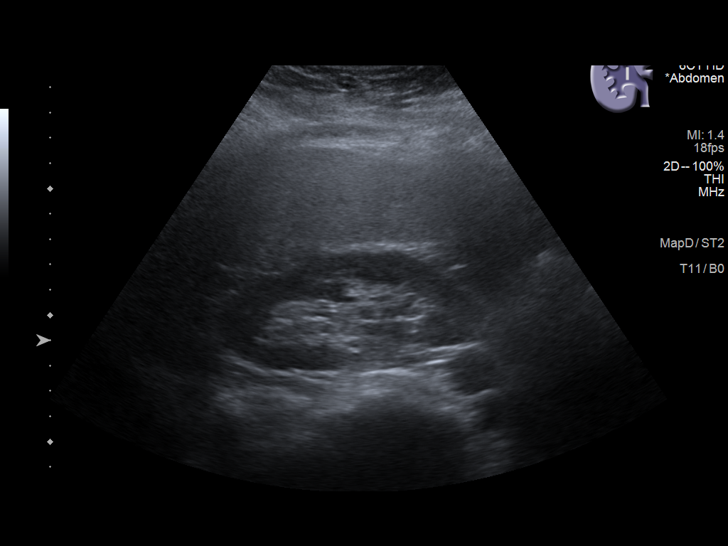
[im 50/75]
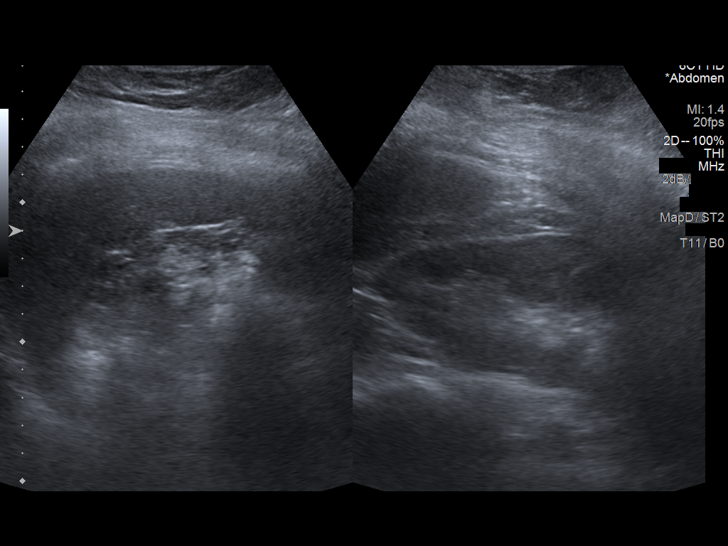
[im 56/75]
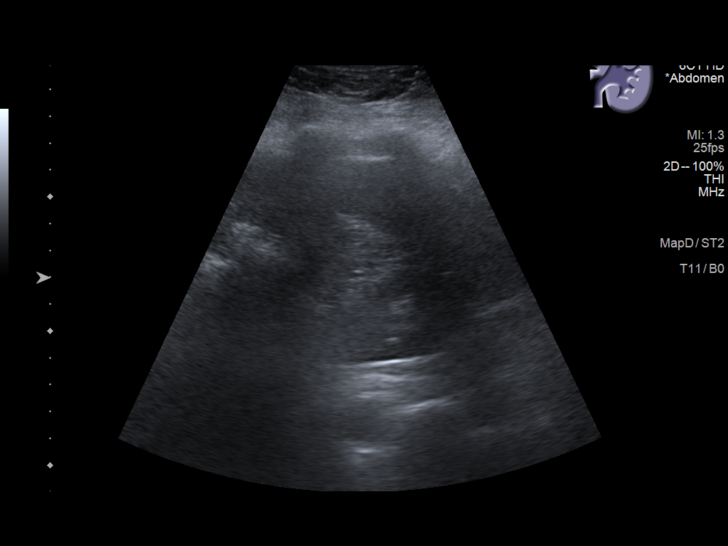
[im 62/75]
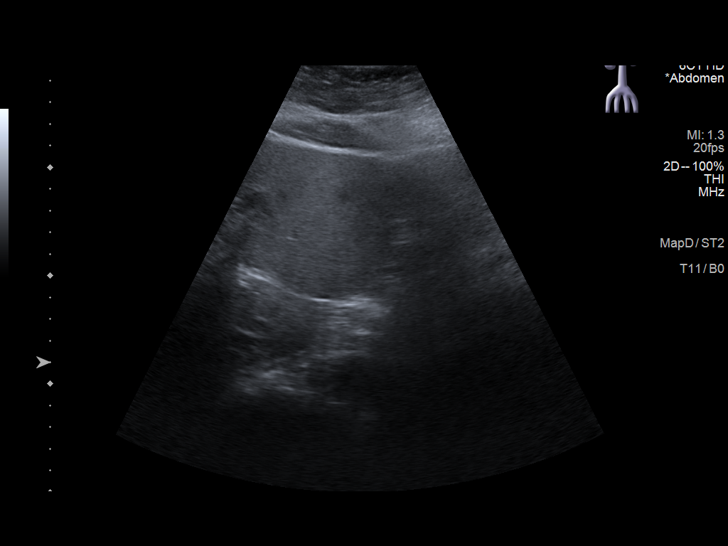
[im 68/75]
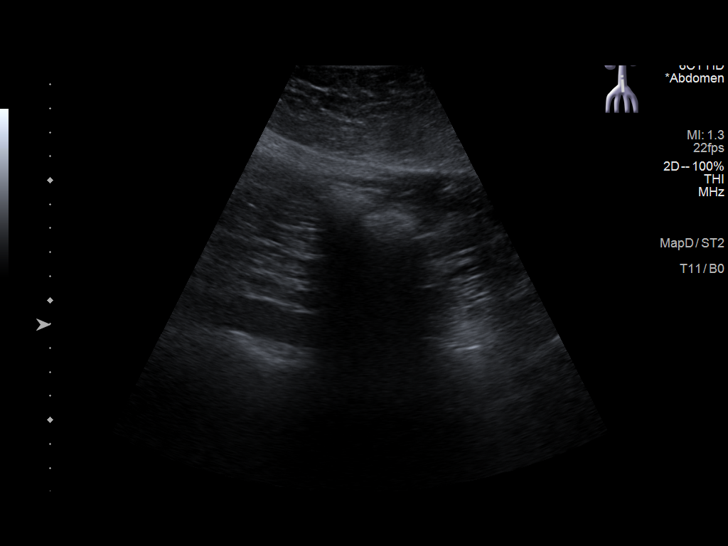
[im 75/75]
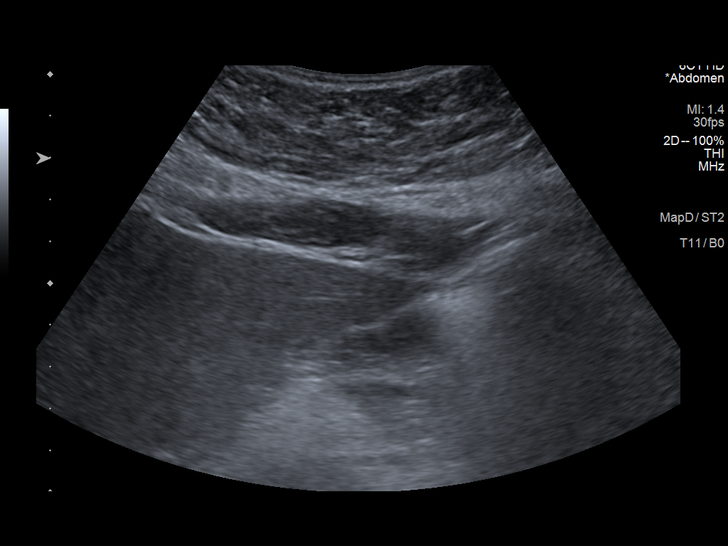

[13 of 25 positions shown; findings below may reference images not displayed]

FINDINGS: Gallbladder: Surgically absent. No abnormality about the gallbladder
fossa.

Common bile duct: Diameter: 2.5 mm

Liver: No focal lesion identified. Liver demonstrates a dense
echogenic echotexture. Portal vein is patent on color Doppler
imaging with normal direction of blood flow towards the liver.

IVC: No abnormality visualized.

Pancreas: Visualized portion unremarkable.

Spleen: Size and appearance within normal limits.

Right Kidney: Length: 11.6 cm. Echogenicity within normal limits. No
mass or hydronephrosis visualized.

Left Kidney: Length: 13.2 cm. Echogenicity within normal limits. No
mass or hydronephrosis visualized.

Abdominal aorta: No aneurysm visualized.

Other findings: Targeted ultrasound of a palpable abnormality of
concern along the midline was performed. Ultrasound demonstrates no
focal mass, collection, or other structure abnormality to correspond
with the palpable abnormality of concern.
IMPRESSION: 1. Negative abdominal ultrasound. No findings to explain patient's
symptoms identified.
2. No mass, collection, or other abnormality identified at the
palpable abnormality of concern along the midline.
3. Prior cholecystectomy. No biliary dilatation.
4. Dense echogenic hepatic echotexture, most commonly seen in the
setting of steatosis.

## 2021-04-05 ENCOUNTER — Other Ambulatory Visit: Payer: Self-pay

## 2021-04-05 ENCOUNTER — Ambulatory Visit
Admission: RE | Admit: 2021-04-05 | Discharge: 2021-04-05 | Disposition: A | Discharge status: Home / Self Care | Payer: Managed Care, Other (non HMO) | Source: Ambulatory Visit | Attending: Endocrinology | Admitting: Endocrinology

## 2021-04-05 DIAGNOSIS — D443 Neoplasm of uncertain behavior of pituitary gland: Secondary | ICD-10-CM

## 2021-04-19 ENCOUNTER — Ambulatory Visit: Payer: 59 | Admitting: Neurology

## 2021-04-19 ENCOUNTER — Other Ambulatory Visit (HOSPITAL_COMMUNITY): Payer: Self-pay

## 2021-04-19 ENCOUNTER — Encounter: Payer: Self-pay | Admitting: Neurology

## 2021-04-19 VITALS — BP 137/70 | HR 67 | Ht 66.0 in | Wt 274.0 lb

## 2021-04-19 DIAGNOSIS — G500 Trigeminal neuralgia: Secondary | ICD-10-CM

## 2021-04-19 MED ORDER — GABAPENTIN 300 MG PO CAPS
900.0000 mg | ORAL_CAPSULE | Freq: Three times a day (TID) | ORAL | 11 refills | Status: DC
  Filled 2021-04-19: qty 270, 30d supply, fill #0
  Filled 2021-10-12: qty 270, 30d supply, fill #1
  Filled 2022-02-27: qty 270, 30d supply, fill #2

## 2021-04-19 MED ORDER — OXTELLAR XR 300 MG PO TB24
300.0000 mg | ORAL_TABLET | Freq: Every day | ORAL | 4 refills | Status: DC
  Filled 2021-04-19: qty 90, 90d supply, fill #0

## 2021-04-19 NOTE — Progress Notes (Signed)
Chief Complaint  Patient presents with   Follow-up    Rm 15. Alone. PCP is Tara Friendly, NP. Pt had gamma knife procedure in August. She states pain in the life side of the face hasn't gone away, but has improved.      ASSESSMENT AND PLAN  Tara Gardner is a 50 y.o. female   Left trigeminal neuralgia  Involving left V3 more than V2 branch, severe pain despite polypharmacy treatment,  Status post, knife treatment in August 2022, symptoms has much improved,  She remains on oxtellar xr 327m qhs, gabapentin 300 mg 3 tablets 3 times a day, if her pain continues to improve, may taper down gabapentin first, then lower dose of oxygen   DIAGNOSTIC DATA (LABS, IMAGING, TESTING) - I reviewed patient records, labs, notes, testing and imaging myself where available.   MEDICAL HISTORY:  Tara Gardner a 50years old right-handed female, seen in refer by  her primary care nurse practitioner EArmanda Gardner for evaluation of jaw pain, initial evaluation was on November 18 2016.   I reviewed and summarized the referring note she had a history of left jaw pain since 2015, initially she thought it was her left lower tooth disease, actually had left wisdom teeth pulled,followed by multiple dental examination, there was no significant abnormality found,   She described pain was a bruise sensation at the left jaw area, radiating to left ear, sharp radiating transient, she was treated with gabapentin 300 mg twice a day, which has helped her symptoms until January 2018, despite regular dose of gabapentin, she continue complains of recurrent left jaw pain, difficulty eating, skin sensitivity at the left upper and lower lips, sometimes triggered to become a headache,   She also reported history of pituitary tumor in 2009,she presented with frequent headache blurry vision , was seen by endocrinologist,no treatment was needed.   UPDATE Feb 03 2018: She continues to have intermittent left facial pain,  mostly at nighttime, despite taking Trileptal 150 mg twice a day, gabapentin 300 mg 3 times a day,  Couple night a month, she woke up at  night with left facial shooting pain, difficulty falling into sleep.    Lab in July 2018 showed normal or negative A1c 5.6, ESR, CRP, TSH, RPR, B12, CMP, CBC.   UPDATE October 28 2018: Her left trigeminal pain was under good control from October 2019 to April 2020, then she began to have frequent flareup of radiating pain from left ear to left upper jaw, sharp transient, frequent occurrence, it is hard for her to talk on the phone, despite titrating dose of Trileptal XR 300 mg every night, gabapentin 300 mg 2 tablets 3 times a day.  She denies significant side effect from medications   Laboratory evaluation in October 2019: Gabapentin level was 1.4, normal CMP, CBC hemoglobin of 12.2, Trileptal level was 3   UPDATE Apr 19 2021: She has gamma knife on August 26th 2022.  Left facial pain has much improved, but continues to have intermittent involvement of left jaw more than left maxillary region, she continued on gabapentin 300 mg 3 times daily, Oxtellar XR 300 mg every night  PHYSICAL EXAM:   Vitals:   04/19/21 0739  BP: 137/70  Pulse: 67  Weight: 274 lb (124.3 kg)  Height: _0  (1.676 m)   Not recorded     Body mass index is 44.22 kg/m.  PHYSICAL EXAMNIATION:  Gen: NAD, conversant, well nourised, well groomed  NEUROLOGICAL EXAM:  MENTAL STATUS: Speech/cognition: Awake, alert, oriented to history taking entire conversation   CRANIAL NERVES: CN II: Visual fields are full to confrontation. Pupils are round equal and briskly reactive to light. CN III, IV, VI: extraocular movement are normal. No ptosis.  CN V: Facial sensation is intact to light touch, Bilateral corneal reflex were symmetric CN VII: Face is symmetric with normal eye closure  CN VIII: Hearing is normal to causal conversation. CN IX, X: Phonation is normal. CN XI: Head  turning and shoulder shrug are intact  MOTOR: There is no pronator drift of out-stretched arms. Muscle bulk and tone are normal. Muscle strength is normal.  REFLEXES: Reflexes are 2+ and symmetric at the biceps, triceps, knees, and ankles. Plantar responses are flexor.  SENSORY: Intact to light touch, pinprick and vibratory sensation are intact in fingers and toes.  COORDINATION: There is no trunk or limb dysmetria noted.  GAIT/STANCE: Posture is normal. Gait is steady with normal steps, base, arm swing, and turning. Heel and toe walking are normal. Tandem gait is normal.  Romberg is absent.  REVIEW OF SYSTEMS:  Full 14 system review of systems performed and notable only for as above All other review of systems were negative.   ALLERGIES: Allergies  Allergen Reactions   Penicillins Other (See Comments)    Childhood allergy - unsure of reaction.    HOME MEDICATIONS: Current Outpatient Medications  Medication Sig Dispense Refill   cyclobenzaprine (FLEXERIL) 5 MG tablet Take 1 tablet (5 mg total) by mouth 3 (three) times daily as needed for muscle spasms. 10 tablet 0   gabapentin (NEURONTIN) 300 MG capsule Take 3 capsules (900 mg total) by mouth 3 (three) times daily. 270 capsule 0   guaiFENesin-codeine 100-10 MG/5ML syrup Take 5 mLs by mouth 3 (three) times daily as needed for cough. 120 mL 0   OXcarbazepine ER (OXTELLAR XR) 300 MG TB24 Take 1 tablet by mouth daily. 90 tablet 0   No current facility-administered medications for this visit.    PAST MEDICAL HISTORY: Past Medical History:  Diagnosis Date   Pituitary tumor    Sciatica    Trigeminal neuralgia     PAST SURGICAL HISTORY: Past Surgical History:  Procedure Laterality Date   CESAREAN SECTION     x 1   CHOLECYSTECTOMY  2009    FAMILY HISTORY: Family History  Problem Relation Age of Onset   Hypertension Mother    Diabetes Mother    Anxiety disorder Mother    Heart attack Father 87    SOCIAL  HISTORY: Social History   Socioeconomic History   Marital status: Married    Spouse name: Not on file   Number of children: 4   Years of education: Some college   Highest education level: Not on file  Occupational History   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   Right-handed.   Occasionally caffeine use.   Social Determinants of Health   Financial Resource Strain: Not on file  Food Insecurity: Not on file  Transportation Needs: Not on file  Physical Activity:  Not on file  Stress: Not on file  Social Connections: Not on file  Intimate Partner Violence: Not on file      Marcial Pacas, M.D. Ph.D.  [facility] [address], [place], [place] [address] Ph: [phone] Fax: [phone]  CC:  Pleas Koch, NP [place],  [place] [address]  Pleas Koch, NP

## 2021-04-26 ENCOUNTER — Other Ambulatory Visit: Payer: Self-pay

## 2021-04-26 ENCOUNTER — Other Ambulatory Visit: Payer: Self-pay | Admitting: Nurse Practitioner

## 2021-04-26 ENCOUNTER — Ambulatory Visit (HOSPITAL_COMMUNITY)
Admission: RE | Admit: 2021-04-26 | Discharge: 2021-04-26 | Disposition: A | Discharge status: Home / Self Care | Payer: No Typology Code available for payment source | Source: Ambulatory Visit | Attending: Nurse Practitioner | Admitting: Nurse Practitioner

## 2021-04-26 DIAGNOSIS — M25561 Pain in right knee: Secondary | ICD-10-CM | POA: Insufficient documentation

## 2021-04-26 NOTE — Progress Notes (Unsigned)
   [facility] [address] [place], [place]  [address] Phone:  [phone]   Fax:  [phone] 

## 2021-04-27 ENCOUNTER — Other Ambulatory Visit: Payer: Self-pay | Admitting: Nurse Practitioner

## 2021-04-27 ENCOUNTER — Other Ambulatory Visit (HOSPITAL_COMMUNITY): Payer: Self-pay

## 2021-04-27 MED ORDER — NAPROXEN 500 MG PO TABS
500.0000 mg | ORAL_TABLET | Freq: Two times a day (BID) | ORAL | 0 refills | Status: AC
  Filled 2021-04-27: qty 30, 15d supply, fill #0

## 2021-06-28 ENCOUNTER — Other Ambulatory Visit: Payer: Self-pay | Admitting: Nurse Practitioner

## 2021-06-28 DIAGNOSIS — Z87898 Personal history of other specified conditions: Secondary | ICD-10-CM

## 2021-06-28 NOTE — Progress Notes (Unsigned)
   [facility] [address] [place], [place]  [address] Phone:  [phone]   Fax:  [phone] 

## 2021-07-12 ENCOUNTER — Other Ambulatory Visit: Payer: Self-pay | Admitting: Nurse Practitioner

## 2021-07-12 DIAGNOSIS — R5383 Other fatigue: Secondary | ICD-10-CM

## 2021-07-12 NOTE — Progress Notes (Unsigned)
   [facility] [address] [place], [place]  [address] Phone:  [phone]   Fax:  [phone] 

## 2021-07-13 ENCOUNTER — Other Ambulatory Visit: Payer: Self-pay | Admitting: Nurse Practitioner

## 2021-07-13 ENCOUNTER — Other Ambulatory Visit (HOSPITAL_COMMUNITY): Payer: Self-pay

## 2021-07-13 DIAGNOSIS — E559 Vitamin D deficiency, unspecified: Secondary | ICD-10-CM

## 2021-07-13 LAB — VITAMIN B12: Vitamin B-12: 326 pg/mL (ref 232–1245)

## 2021-07-13 LAB — VITAMIN D 25 HYDROXY (VIT D DEFICIENCY, FRACTURES): Vit D, 25-Hydroxy: 18.5 ng/mL — ABNORMAL LOW (ref 30.0–100.0)

## 2021-07-13 MED ORDER — VITAMIN D (ERGOCALCIFEROL) 1.25 MG (50000 UNIT) PO CAPS
50000.0000 [IU] | ORAL_CAPSULE | ORAL | 0 refills | Status: AC
  Filled 2021-07-13: qty 12, 84d supply, fill #0

## 2021-08-13 ENCOUNTER — Other Ambulatory Visit (HOSPITAL_COMMUNITY): Payer: Self-pay

## 2021-08-13 ENCOUNTER — Other Ambulatory Visit (HOSPITAL_COMMUNITY): Payer: Self-pay | Admitting: Nurse Practitioner

## 2021-08-13 DIAGNOSIS — G500 Trigeminal neuralgia: Secondary | ICD-10-CM

## 2021-08-13 MED ORDER — METHOCARBAMOL 500 MG PO TABS
500.0000 mg | ORAL_TABLET | Freq: Four times a day (QID) | ORAL | 0 refills | Status: DC | PRN
  Filled 2021-08-13: qty 30, 8d supply, fill #0

## 2021-10-12 ENCOUNTER — Other Ambulatory Visit (HOSPITAL_COMMUNITY): Payer: Self-pay

## 2021-10-12 ENCOUNTER — Other Ambulatory Visit: Payer: Self-pay | Admitting: Neurology

## 2021-11-02 ENCOUNTER — Other Ambulatory Visit (HOSPITAL_COMMUNITY): Payer: Self-pay

## 2021-11-02 ENCOUNTER — Other Ambulatory Visit: Payer: Self-pay | Admitting: Family Medicine

## 2021-11-02 DIAGNOSIS — H6691 Otitis media, unspecified, right ear: Secondary | ICD-10-CM

## 2021-11-02 MED ORDER — CIPROFLOXACIN-DEXAMETHASONE 0.3-0.1 % OT SUSP
4.0000 [drp] | Freq: Two times a day (BID) | OTIC | 0 refills | Status: AC
  Filled 2021-11-02: qty 7.5, 7d supply, fill #0

## 2021-11-02 NOTE — Progress Notes (Signed)
Meds ordered this encounter  Medications   ciprofloxacin-dexamethasone (CIPRODEX) OTIC suspension    Sig: Place 4 drops into the right ear 2 (two) times daily for 5 days.    Dispense:  7.5 mL    Refill:  0    Order Specific Question:   Supervising Provider    Answer:   Tresa Garter [[id number]]   Donia Pounds  APRN, MSN, FNP-C [facility] [address] [place], [place] [address] [phone]

## 2022-01-04 ENCOUNTER — Encounter: Payer: Self-pay | Admitting: Nurse Practitioner

## 2022-01-04 ENCOUNTER — Telehealth: Payer: Self-pay

## 2022-01-04 ENCOUNTER — Ambulatory Visit (INDEPENDENT_AMBULATORY_CARE_PROVIDER_SITE_OTHER): Payer: No Typology Code available for payment source | Admitting: Nurse Practitioner

## 2022-01-04 ENCOUNTER — Other Ambulatory Visit (HOSPITAL_COMMUNITY): Payer: Self-pay

## 2022-01-04 VITALS — BP 123/81 | HR 76 | Temp 98.1°F | Ht 66.0 in | Wt 281.2 lb

## 2022-01-04 DIAGNOSIS — Z6841 Body Mass Index (BMI) 40.0 and over, adult: Secondary | ICD-10-CM

## 2022-01-04 MED ORDER — OZEMPIC (0.25 OR 0.5 MG/DOSE) 2 MG/3ML ~~LOC~~ SOPN
0.2500 mg | PEN_INJECTOR | SUBCUTANEOUS | 4 refills | Status: DC
  Filled 2022-01-04: qty 3, fill #0

## 2022-01-04 MED ORDER — WEGOVY 0.25 MG/0.5ML ~~LOC~~ SOAJ
0.2500 mg | SUBCUTANEOUS | 3 refills | Status: DC
  Filled 2022-01-04 – 2022-05-06 (×3): qty 2, 28d supply, fill #0
  Filled 2022-06-05 – 2022-06-17 (×2): qty 2, 28d supply, fill #1
  Filled 2022-08-26 – 2022-09-05 (×2): qty 2, 28d supply, fill #2

## 2022-01-04 NOTE — Assessment & Plan Note (Signed)
-   Semaglutide,0.25 or 0.'5MG'$ /DOS, (OZEMPIC, 0.25 OR 0.5 MG/DOSE,) 2 MG/3ML SOPN; Inject 0.25 mg into the skin once a week.  Dispense: 3 mL; Refill: 4  Follow up:  Follow up in 1 month or sooner

## 2022-01-04 NOTE — Patient Instructions (Signed)
1. Class 3 severe obesity due to excess calories without serious comorbidity with body mass index (BMI) of 45.0 to 49.9 in adult ([place])  - Semaglutide,0.25 or 0.'5MG'$ /DOS, (OZEMPIC, 0.25 OR 0.5 MG/DOSE,) 2 MG/3ML SOPN; Inject 0.25 mg into the skin once a week.  Dispense: 3 mL; Refill: 4  Follow up:  Follow up in 1 month or sooner

## 2022-01-04 NOTE — Progress Notes (Signed)
$'@Patient'w$  ID: Tara Gardner, female    DOB: 1970/06/10, 51 y.o.   MRN: [id number]  Chief Complaint  Patient presents with   Office Visit    Pt is here for [place]     Referring provider: Pleas Koch, NP   HPI  Patient presents today for weight management. Has tried and failed phentermine. BMI is at 45.39. she is trying to work on portion size. Overall doing well except for reflux. She is hoping that loosing weight will also help with reflux. Denies f/c/s, n/v/d, hemoptysis, PND, leg swelling Denies chest pain or edema      Allergies  Allergen Reactions   Penicillins Other (See Comments)    Childhood allergy - unsure of reaction.    Immunization History  Administered Date(s) Administered   PFIZER Comirnaty([place])Covid-19 Tri-Sucrose Vaccine 10/28/2019    Past Medical History:  Diagnosis Date   Pituitary tumor    Sciatica    Trigeminal neuralgia     Tobacco History: Social History   Tobacco Use  Smoking Status Never  Smokeless Tobacco Never   Counseling given: Not Answered   Outpatient Encounter Medications as of 01/04/2022  Medication Sig   Semaglutide-Weight Management (WEGOVY) 0.25 MG/0.5ML SOAJ Inject 0.25 mg into the skin once a week.   [DISCONTINUED] Semaglutide,0.25 or 0.'5MG'$ /DOS, (OZEMPIC, 0.25 OR 0.5 MG/DOSE,) 2 MG/3ML SOPN Inject 0.25 mg into the skin once a week.   gabapentin (NEURONTIN) 300 MG capsule Take 3 capsules (900 mg total) by mouth 3 (three) times daily.   guaiFENesin-codeine 100-10 MG/5ML syrup Take 5 mLs by mouth 3 (three) times daily as needed for cough.   methocarbamol (ROBAXIN) 500 MG tablet Take 1 tablet (500 mg total) by mouth every 6 (six) hours as needed for muscle spasms (facial pain).   OXcarbazepine ER (OXTELLAR XR) 300 MG TB24 Take 1 tablet by mouth daily.   No facility-administered encounter medications on file as of 01/04/2022.     Review of Systems  Review of Systems  Constitutional: Negative.   HENT: Negative.     Cardiovascular: Negative.   Gastrointestinal: Negative.   Allergic/Immunologic: Negative.   Neurological: Negative.   Psychiatric/Behavioral: Negative.         Physical Exam  BP 123/81 (BP Location: Right Arm, Patient Position: Sitting, Cuff Size: Normal)   Pulse 76   Temp 98.1 F (36.7 C)   Ht '5\' 6"'$  (1.676 m)   Wt 281 lb 3.2 oz (127.6 kg)   SpO2 100%   BMI 45.39 kg/m   Wt Readings from Last 5 Encounters:  01/04/22 281 lb 3.2 oz (127.6 kg)  04/19/21 274 lb (124.3 kg)  09/02/19 286 lb (129.7 kg)  06/09/19 289 lb 4 oz (131.2 kg)  10/28/18 286 lb 8 oz (130 kg)     Physical Exam Vitals and nursing note reviewed.  Constitutional:      General: She is not in acute distress.    Appearance: She is well-developed.  Cardiovascular:     Rate and Rhythm: Normal rate and regular rhythm.  Pulmonary:     Effort: Pulmonary effort is normal.     Breath sounds: Normal breath sounds.  Neurological:     Mental Status: She is alert and oriented to person, place, and time.      Lab Results:  CBC    Component Value Date/Time   WBC 8.1 06/09/2019 1534   RBC 4.06 06/09/2019 1534   HGB 12.4 06/09/2019 1534   HGB 12.2 02/03/2018 0805  HCT 37.4 06/09/2019 1534   HCT 38.2 02/03/2018 0805   PLT 293 06/09/2019 1534   PLT 295 02/03/2018 0805   MCV 92.1 06/09/2019 1534   MCV 93 02/03/2018 0805   MCH 30.5 06/09/2019 1534   MCHC 33.2 06/09/2019 1534   RDW 14.0 06/09/2019 1534   RDW 15.3 02/03/2018 0805   LYMPHSABS 2,268 06/09/2019 1534   LYMPHSABS 2.4 11/18/2016 1526   MONOABS 0.8 10/29/2007 0340   EOSABS 89 06/09/2019 1534   EOSABS 0.1 11/18/2016 1526   BASOSABS 32 06/09/2019 1534   BASOSABS 0.0 11/18/2016 1526    BMET    Component Value Date/Time   NA 139 06/09/2019 1534   NA 140 02/03/2018 0805   K 4.0 06/09/2019 1534   CL 104 06/09/2019 1534   CO2 27 06/09/2019 1534   GLUCOSE 110 (H) 06/09/2019 1534   BUN 9 06/09/2019 1534   BUN 11 02/03/2018 0805    CREATININE 0.63 06/09/2019 1534   CALCIUM 9.3 06/09/2019 1534   GFRNONAA 101 02/03/2018 0805   GFRAA 116 02/03/2018 0805    BNP No results found for: "BNP"  ProBNP No results found for: "PROBNP"  Imaging: No results found.   Assessment & Plan:   Class 3 severe obesity due to excess calories without serious comorbidity with body mass index (BMI) of 45.0 to 49.9 in adult ([place]) - Semaglutide,0.25 or 0.'5MG'$ /DOS, (OZEMPIC, 0.25 OR 0.5 MG/DOSE,) 2 MG/3ML SOPN; Inject 0.25 mg into the skin once a week.  Dispense: 3 mL; Refill: 4  Follow up:  Follow up in 1 month or sooner     Fenton Foy, NP 01/04/2022

## 2022-01-04 NOTE — Telephone Encounter (Signed)
[facility] PA submitted, waiting on ins decision.  KEY: [id number]

## 2022-01-05 LAB — CBC
Hematocrit: 37.6 % (ref 34.0–46.6)
Hemoglobin: 11.9 g/dL (ref 11.1–15.9)
MCH: 28.2 pg (ref 26.6–33.0)
MCHC: 31.6 g/dL (ref 31.5–35.7)
MCV: 89 fL (ref 79–97)
Platelets: 306 10*3/uL (ref 150–450)
RBC: 4.22 x10E6/uL (ref 3.77–5.28)
RDW: 13.8 % (ref 11.7–15.4)
WBC: 6.7 10*3/uL (ref 3.4–10.8)

## 2022-01-05 LAB — PROLACTIN: Prolactin: 42.7 ng/mL — ABNORMAL HIGH (ref 4.8–23.3)

## 2022-01-05 LAB — COMPREHENSIVE METABOLIC PANEL
ALT: 16 IU/L (ref 0–32)
AST: 17 IU/L (ref 0–40)
Albumin/Globulin Ratio: 1.8 (ref 1.2–2.2)
Albumin: 4.3 g/dL (ref 3.9–4.9)
Alkaline Phosphatase: 86 IU/L (ref 44–121)
BUN/Creatinine Ratio: 20 (ref 9–23)
BUN: 14 mg/dL (ref 6–24)
Bilirubin Total: 0.3 mg/dL (ref 0.0–1.2)
CO2: 23 mmol/L (ref 20–29)
Calcium: 8.9 mg/dL (ref 8.7–10.2)
Chloride: 104 mmol/L (ref 96–106)
Creatinine, Ser: 0.71 mg/dL (ref 0.57–1.00)
Globulin, Total: 2.4 g/dL (ref 1.5–4.5)
Glucose: 103 mg/dL — ABNORMAL HIGH (ref 70–99)
Potassium: 4 mmol/L (ref 3.5–5.2)
Sodium: 141 mmol/L (ref 134–144)
Total Protein: 6.7 g/dL (ref 6.0–8.5)
eGFR: 104 mL/min/{1.73_m2} (ref >59)

## 2022-01-05 LAB — LIPID PANEL
Chol/HDL Ratio: 3.9 ratio (ref 0.0–4.4)
Cholesterol, Total: 173 mg/dL (ref 100–199)
HDL: 44 mg/dL (ref >39)
LDL Chol Calc (NIH): 114 mg/dL — ABNORMAL HIGH (ref 0–99)
Triglycerides: 79 mg/dL (ref 0–149)
VLDL Cholesterol Cal: 15 mg/dL (ref 5–40)

## 2022-01-07 ENCOUNTER — Other Ambulatory Visit (HOSPITAL_COMMUNITY): Payer: Self-pay

## 2022-01-07 NOTE — Telephone Encounter (Signed)
PA approved until 07/29/2022

## 2022-01-09 ENCOUNTER — Other Ambulatory Visit (HOSPITAL_COMMUNITY): Payer: Self-pay

## 2022-01-09 ENCOUNTER — Other Ambulatory Visit: Payer: Self-pay | Admitting: Nurse Practitioner

## 2022-01-09 LAB — POCT URINALYSIS DIP (CLINITEK)
Bilirubin, UA: NEGATIVE
Glucose, UA: NEGATIVE mg/dL
Ketones, POC UA: NEGATIVE mg/dL
Leukocytes, UA: NEGATIVE
Nitrite, UA: POSITIVE — AB
POC PROTEIN,UA: NEGATIVE
Spec Grav, UA: 1.015 (ref 1.010–1.025)
Urobilinogen, UA: 0.2 E.U./dL
pH, UA: 6 (ref 5.0–8.0)

## 2022-01-09 MED ORDER — SULFAMETHOXAZOLE-TRIMETHOPRIM 800-160 MG PO TABS
1.0000 | ORAL_TABLET | Freq: Two times a day (BID) | ORAL | 0 refills | Status: AC
  Filled 2022-01-09: qty 6, 3d supply, fill #0

## 2022-01-09 NOTE — Addendum Note (Signed)
Addended by: Schuyler Amor on: 01/09/2022 01:44 PM   Modules accepted: Orders

## 2022-01-11 LAB — URINE CULTURE

## 2022-01-25 ENCOUNTER — Other Ambulatory Visit: Payer: Self-pay | Admitting: Endocrinology

## 2022-01-25 DIAGNOSIS — D443 Neoplasm of uncertain behavior of pituitary gland: Secondary | ICD-10-CM

## 2022-01-30 ENCOUNTER — Other Ambulatory Visit (HOSPITAL_COMMUNITY): Payer: Self-pay

## 2022-02-07 ENCOUNTER — Other Ambulatory Visit (HOSPITAL_COMMUNITY): Payer: Self-pay

## 2022-02-13 ENCOUNTER — Other Ambulatory Visit (HOSPITAL_COMMUNITY): Payer: Self-pay

## 2022-02-27 ENCOUNTER — Other Ambulatory Visit (HOSPITAL_COMMUNITY): Payer: Self-pay

## 2022-03-18 ENCOUNTER — Other Ambulatory Visit (HOSPITAL_COMMUNITY): Payer: Self-pay

## 2022-04-19 ENCOUNTER — Other Ambulatory Visit (HOSPITAL_COMMUNITY): Payer: Self-pay

## 2022-04-24 ENCOUNTER — Other Ambulatory Visit: Payer: Self-pay | Admitting: Nurse Practitioner

## 2022-04-24 ENCOUNTER — Other Ambulatory Visit (HOSPITAL_COMMUNITY): Payer: Self-pay

## 2022-04-24 MED ORDER — ALBUTEROL SULFATE HFA 108 (90 BASE) MCG/ACT IN AERS
2.0000 | INHALATION_SPRAY | Freq: Four times a day (QID) | RESPIRATORY_TRACT | 2 refills | Status: DC | PRN
  Filled 2022-04-24: qty 6.7, 25d supply, fill #0

## 2022-05-06 ENCOUNTER — Other Ambulatory Visit (HOSPITAL_COMMUNITY): Payer: Self-pay

## 2022-05-08 ENCOUNTER — Other Ambulatory Visit: Payer: Self-pay

## 2022-06-05 ENCOUNTER — Other Ambulatory Visit: Payer: Self-pay | Admitting: Neurology

## 2022-06-06 ENCOUNTER — Other Ambulatory Visit (HOSPITAL_COMMUNITY): Payer: Self-pay

## 2022-06-06 ENCOUNTER — Other Ambulatory Visit: Payer: Self-pay

## 2022-06-06 MED ORDER — GABAPENTIN 300 MG PO CAPS
900.0000 mg | ORAL_CAPSULE | Freq: Three times a day (TID) | ORAL | 0 refills | Status: DC
  Filled 2022-06-06 – 2022-06-17 (×2): qty 270, 30d supply, fill #0

## 2022-06-14 ENCOUNTER — Other Ambulatory Visit (HOSPITAL_COMMUNITY): Payer: Self-pay

## 2022-06-17 ENCOUNTER — Other Ambulatory Visit (HOSPITAL_COMMUNITY): Payer: Self-pay

## 2022-08-26 ENCOUNTER — Other Ambulatory Visit: Payer: Self-pay | Admitting: Neurology

## 2022-08-29 ENCOUNTER — Other Ambulatory Visit: Payer: Self-pay

## 2022-09-05 ENCOUNTER — Other Ambulatory Visit: Payer: Self-pay | Admitting: Neurology

## 2022-09-06 ENCOUNTER — Other Ambulatory Visit (HOSPITAL_COMMUNITY): Payer: Self-pay

## 2022-09-12 ENCOUNTER — Telehealth: Payer: Self-pay | Admitting: Neurology

## 2022-09-12 NOTE — Telephone Encounter (Signed)
Pt called to schedule year f/u but also stated that she is needing to  file for FMLA. Pt will start process on the [employer] app but is needing to speak to there RN to make sure it is processed.

## 2022-09-17 ENCOUNTER — Other Ambulatory Visit (HOSPITAL_COMMUNITY): Payer: Self-pay

## 2022-09-17 ENCOUNTER — Other Ambulatory Visit: Payer: Self-pay | Admitting: Neurology

## 2022-09-17 MED ORDER — GABAPENTIN 300 MG PO CAPS
900.0000 mg | ORAL_CAPSULE | Freq: Three times a day (TID) | ORAL | 0 refills | Status: DC
  Filled 2022-09-17: qty 270, 30d supply, fill #0

## 2022-11-14 ENCOUNTER — Other Ambulatory Visit (HOSPITAL_COMMUNITY): Payer: Self-pay

## 2022-11-14 ENCOUNTER — Telehealth: Payer: Self-pay

## 2022-11-14 ENCOUNTER — Ambulatory Visit: Payer: 59 | Admitting: Neurology

## 2022-11-14 ENCOUNTER — Telehealth: Payer: Self-pay | Admitting: Neurology

## 2022-11-14 ENCOUNTER — Encounter: Payer: Self-pay | Admitting: Neurology

## 2022-11-14 VITALS — BP 124/68 | HR 70 | Resp 16 | Ht 67.0 in

## 2022-11-14 DIAGNOSIS — G500 Trigeminal neuralgia: Secondary | ICD-10-CM | POA: Diagnosis not present

## 2022-11-14 MED ORDER — GABAPENTIN 300 MG PO CAPS
300.0000 mg | ORAL_CAPSULE | Freq: Three times a day (TID) | ORAL | 5 refills | Status: DC
Start: 2022-11-14 — End: 2023-09-03
  Filled 2022-11-14: qty 90, 30d supply, fill #0
  Filled 2023-01-22: qty 90, 30d supply, fill #1
  Filled 2023-04-24: qty 90, 30d supply, fill #2
  Filled 2023-08-16: qty 90, 30d supply, fill #3

## 2022-11-14 NOTE — Patient Instructions (Signed)
Referral to Dr. Maurice Small for evaluation of left sided trigeminal neuralgia, continue gabapentin  Meds ordered this encounter  Medications   gabapentin (NEURONTIN) 300 MG capsule    Sig: Take 1 capsule (300 mg total) by mouth 3 (three) times daily.    Dispense:  90 capsule    Refill:  5

## 2022-11-14 NOTE — Progress Notes (Signed)
Chief Complaint  Patient presents with   Follow-up    Rm12, alone Left-sided trigeminal neuralgia:pain has increased to the point where she has restless nights, she would like to get fmla    ASSESSMENT AND PLAN  Tara Gardner is a 52 y.o. female   1.  Left trigeminal neuralgia -Return of pain, gamma knife August 2022 at [facility], pain mostly in remission until January 2024 -Refill gabapentin 300 mg 3 times daily, wishes to remain off Oxtellar for now -Would like FMLA paperwork, pain flares at night -Referral to Dr. Maurice Small, patient would like another health system opinion about treatment options -Follow-up 6 months or sooner if needed  DIAGNOSTIC DATA (LABS, IMAGING, TESTING) - I reviewed patient records, labs, notes, testing and imaging myself where available.   MEDICAL HISTORY:  Tara Gardner is a 52 years old right-handed female, seen in refer by  her primary care nurse practitioner Filomena Jungling, for evaluation of jaw pain, initial evaluation was on November 18 2016.   I reviewed and summarized the referring note she had a history of left jaw pain since 2015, initially she thought it was her left lower tooth disease, actually had left wisdom teeth pulled,followed by multiple dental examination, there was no significant abnormality found,   She described pain was a bruise sensation at the left jaw area, radiating to left ear, sharp radiating transient, she was treated with gabapentin 300 mg twice a day, which has helped her symptoms until January 2018, despite regular dose of gabapentin, she continue complains of recurrent left jaw pain, difficulty eating, skin sensitivity at the left upper and lower lips, sometimes triggered to become a headache,   She also reported history of pituitary tumor in 2009,she presented with frequent headache blurry vision , was seen by endocrinologist,no treatment was needed.   UPDATE Feb 03 2018: She continues to have intermittent left facial  pain, mostly at nighttime, despite taking Trileptal 150 mg twice a day, gabapentin 300 mg 3 times a day,  Couple night a month, she woke up at  night with left facial shooting pain, difficulty falling into sleep.    Lab in July 2018 showed normal or negative A1c 5.6, ESR, CRP, TSH, RPR, B12, CMP, CBC.   UPDATE October 28 2018: Her left trigeminal pain was under good control from October 2019 to April 2020, then she began to have frequent flareup of radiating pain from left ear to left upper jaw, sharp transient, frequent occurrence, it is hard for her to talk on the phone, despite titrating dose of Trileptal XR 300 mg every night, gabapentin 300 mg 2 tablets 3 times a day.  She denies significant side effect from medications   Laboratory evaluation in October 2019: Gabapentin level was 1.4, normal CMP, CBC hemoglobin of 12.2, Trileptal level was 3   UPDATE Apr 19 2021: She has gamma knife on August 26th 2022.  Left facial pain has much improved, but continues to have intermittent involvement of left jaw more than left maxillary region, she continued on gabapentin 300 mg 3 times daily, Oxtellar XR 300 mg every night  Update November 14, 2022 SS: Has not been seen since December 2022. Gamma knife in August 2022, did well up until January 2024, it never completely resolved, but worsening now. 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 Flares at night, left sided V2-3. [facility] has point system, has been late for work, takes gabapentin 300 mg 2 at night, has not  gone back to Oxtellar XR. Has FMLA papers, wants to avoid points. Stress with her mother who has cancer.   PHYSICAL EXAM:   Vitals:   11/14/22 1548  BP: 124/68  Pulse: 70  Resp: 16  Height: 5\' 7"  (1.702 m)   Not recorded    Body mass index is 44.04 kg/m.  PHYSICAL EXAMNIATION:  Gen: NAD, conversant, well nourised, well groomed         NEUROLOGICAL EXAM:  MENTAL STATUS: Speech/cognition: Awake, alert, oriented to  history taking entire conversation   CRANIAL NERVES: CN II: Visual fields are full to confrontation. Pupils are round equal and briskly reactive to light. CN III, IV, VI: extraocular movement are normal. No ptosis.  CN V: Facial sensation is intact to light touch, Bilateral corneal reflex were symmetric CN VII: Face is symmetric with normal eye closure  CN VIII: Hearing is normal to causal conversation. CN IX, X: Phonation is normal. CN XI: Head turning and shoulder shrug are intact  MOTOR: There is no pronator drift of out-stretched arms. Muscle bulk and tone are normal. Muscle strength is normal.  REFLEXES: Reflexes are 2+ and symmetric at the biceps, triceps, knees, and ankles. Plantar responses are flexor.  SENSORY: Intact to light touch, pinprick and vibratory sensation are intact in fingers and toes.  COORDINATION: There is no trunk or limb dysmetria noted.  GAIT/STANCE: Posture is normal. Gait is steady with normal steps  REVIEW OF SYSTEMS:  Full 14 system review of systems performed and notable only for as above All other review of systems were negative.   ALLERGIES: Allergies  Allergen Reactions   Penicillins Other (See Comments)    Childhood allergy - unsure of reaction.    HOME MEDICATIONS: Current Outpatient Medications  Medication Sig Dispense Refill   albuterol (VENTOLIN HFA) 108 (90 Base) MCG/ACT inhaler Inhale 2 puffs into the lungs every 6 (six) hours as needed for wheezing or shortness of breath. 6.7 g 2   gabapentin (NEURONTIN) 300 MG capsule Take 1 capsule (300 mg total) by mouth 3 (three) times daily. 90 capsule 5   guaiFENesin-codeine 100-10 MG/5ML syrup Take 5 mLs by mouth 3 (three) times daily as needed for cough. 120 mL 0   methocarbamol (ROBAXIN) 500 MG tablet Take 1 tablet (500 mg total) by mouth every 6 (six) hours as needed for muscle spasms (facial pain). 30 tablet 0   Semaglutide-Weight Management (WEGOVY) 0.25 MG/0.5ML SOAJ Inject 0.25 mg  into the skin once a week. 2 mL 3   No current facility-administered medications for this visit.    PAST MEDICAL HISTORY: Past Medical History:  Diagnosis Date   Pituitary tumor    Sciatica    Trigeminal neuralgia     PAST SURGICAL HISTORY: Past Surgical History:  Procedure Laterality Date   CESAREAN SECTION     x 1   CHOLECYSTECTOMY  2009    FAMILY HISTORY: Family History  Problem Relation Age of Onset   Hypertension Mother    Diabetes Mother    Anxiety disorder Mother    Heart attack Father 52    SOCIAL HISTORY: Social History   Socioeconomic History   Marital status: Married    Spouse name: eulias   Number of children: 4   Years of education: Some college   Highest education level: Some college, no degree  Occupational History   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   Right-handed.   Occasionally caffeine use.   Social Determinants of Health   Financial Resource Strain: Not on file  Food Insecurity: Not on file  Transportation Needs: Not on file  Physical Activity: Not on file  Stress: Not on file  Social Connections: Not on file  Intimate Partner Violence: Not on file   Margie Ege, Edrick Oh, DNP  [facility] [address] [place], [place] [address] [phone]

## 2022-11-14 NOTE — Telephone Encounter (Signed)
FMLA paperwork has been completed and placed in NP basket for signature.

## 2022-11-14 NOTE — Telephone Encounter (Signed)
Referral for neurosurgery fax to [facility]. Phone: [phone], Fax: [phone] 

## 2022-12-10 ENCOUNTER — Other Ambulatory Visit (HOSPITAL_COMMUNITY): Payer: Self-pay

## 2022-12-10 ENCOUNTER — Ambulatory Visit (INDEPENDENT_AMBULATORY_CARE_PROVIDER_SITE_OTHER): Payer: 59 | Admitting: Nurse Practitioner

## 2022-12-10 ENCOUNTER — Encounter: Payer: Self-pay | Admitting: Nurse Practitioner

## 2022-12-10 VITALS — BP 117/71 | HR 66 | Wt 265.0 lb

## 2022-12-10 DIAGNOSIS — Z1211 Encounter for screening for malignant neoplasm of colon: Secondary | ICD-10-CM | POA: Diagnosis not present

## 2022-12-10 DIAGNOSIS — Z8049 Family history of malignant neoplasm of other genital organs: Secondary | ICD-10-CM | POA: Diagnosis not present

## 2022-12-10 DIAGNOSIS — R1319 Other dysphagia: Secondary | ICD-10-CM | POA: Diagnosis not present

## 2022-12-10 DIAGNOSIS — K219 Gastro-esophageal reflux disease without esophagitis: Secondary | ICD-10-CM | POA: Diagnosis not present

## 2022-12-10 MED ORDER — PANTOPRAZOLE SODIUM 40 MG PO TBEC
40.0000 mg | DELAYED_RELEASE_TABLET | Freq: Every day | ORAL | 2 refills | Status: AC
Start: 2022-12-10 — End: ?
  Filled 2022-12-10: qty 30, 30d supply, fill #0

## 2022-12-10 NOTE — Assessment & Plan Note (Addendum)
1. Gastroesophageal reflux disease, unspecified whether esophagitis present  - pantoprazole (PROTONIX) 40 MG tablet; Take 1 tablet (40 mg total) by mouth daily.  Dispense: 30 tablet; Refill: 2 - Ambulatory referral to Gastroenterology - H. pylori breath test Education on GERD presented.  Encouraged to avoid fatty fried foods spicy foods alcohol.  Avoid laying down immediately after eating Patient referred to GI

## 2022-12-10 NOTE — Assessment & Plan Note (Signed)
Chronic condition Encouraged to chew food well before swallowing to prevent choking Patient referred to GI

## 2022-12-10 NOTE — Progress Notes (Addendum)
Acute Office Visit  Subjective:     Patient ID: Tara Gardner, female    DOB: 1970-07-21, 52 y.o.   MRN: [id number]  Chief Complaint  Patient presents with   Abdominal Pain    Started last week , burping  and burning nothing makes it feel better     HPI Tara Gardner  has a past medical history of Pituitary tumor, Sciatica, and Trigeminal neuralgia.  Patient presents with complaints of worsening burning pain in the epigastric area, lots of burping acid reflux and little nausea for the past 1 week.  Sometimes has trouble with swallowing. she had tried OTC omeprazole in the past for her acid reflux.  She denies fever, chills, diarrhea unintentional weight loss. Has history of H, pyloric infection    Review of Systems  Constitutional:  Negative for activity change, appetite change, chills, fatigue and fever.  HENT:  Negative for congestion, dental problem, ear discharge, ear pain, hearing loss, rhinorrhea, sinus pressure, sinus pain, sneezing and sore throat.   Eyes:  Negative for pain, discharge and itching.  Respiratory:  Negative for cough, chest tightness, shortness of breath and wheezing.   Cardiovascular:  Negative for chest pain, palpitations and leg swelling.  Gastrointestinal:  Positive for abdominal pain and nausea. Negative for abdominal distention, anal bleeding, blood in stool, constipation, diarrhea, rectal pain and vomiting.  Genitourinary:  Negative for difficulty urinating, dysuria, flank pain, frequency, hematuria, menstrual problem, pelvic pain and vaginal bleeding.  Musculoskeletal:  Negative for arthralgias, back pain, gait problem, joint swelling and myalgias.  Skin:  Negative for color change, pallor, rash and wound.  Neurological:  Negative for dizziness, tremors, facial asymmetry, weakness and headaches.  Hematological:  Negative for adenopathy. Does not bruise/bleed easily.  Psychiatric/Behavioral:  Negative for agitation, behavioral problems, confusion,  decreased concentration, hallucinations, self-injury and suicidal ideas.         Objective:    BP 117/71   Pulse 66   Wt 265 lb (120.2 kg)   LMP 10/21/2022 (Exact Date)   SpO2 99%   BMI 41.50 kg/m    Physical Exam Vitals and nursing note reviewed.  Constitutional:      General: She is not in acute distress.    Appearance: Normal appearance. She is obese. She is not ill-appearing, toxic-appearing or diaphoretic.  HENT:     Mouth/Throat:     Mouth: Mucous membranes are moist.     Pharynx: Oropharynx is clear. No oropharyngeal exudate or posterior oropharyngeal erythema.  Eyes:     General: No scleral icterus.       Right eye: No discharge.        Left eye: No discharge.     Extraocular Movements: Extraocular movements intact.     Conjunctiva/sclera: Conjunctivae normal.  Cardiovascular:     Rate and Rhythm: Normal rate and regular rhythm.     Pulses: Normal pulses.     Heart sounds: Normal heart sounds. No murmur heard.    No friction rub. No gallop.  Pulmonary:     Effort: Pulmonary effort is normal. No respiratory distress.     Breath sounds: Normal breath sounds. No stridor. No wheezing, rhonchi or rales.  Chest:     Chest wall: No tenderness.  Abdominal:     General: There is no distension.     Palpations: Abdomen is soft.     Tenderness: There is abdominal tenderness. There is no right CVA tenderness, left CVA tenderness or guarding.     Comments:  mid epigastric tenderness on palpation  Musculoskeletal:        General: No swelling, tenderness, deformity or signs of injury.     Right lower leg: No edema.     Left lower leg: No edema.  Skin:    General: Skin is warm and dry.     Capillary Refill: Capillary refill takes less than 2 seconds.     Coloration: Skin is not jaundiced or pale.     Findings: No bruising, erythema or lesion.  Neurological:     Mental Status: She is alert and oriented to person, place, and time.     Motor: No weakness.      Coordination: Coordination normal.     Gait: Gait normal.  Psychiatric:        Mood and Affect: Mood normal.        Behavior: Behavior normal.        Thought Content: Thought content normal.        Judgment: Judgment normal.     No results found for any visits on 12/10/22.      Assessment & Plan:   Problem List Items Addressed This Visit       Digestive   Gastroesophageal reflux disease - Primary    1. Gastroesophageal reflux disease, unspecified whether esophagitis present  - pantoprazole (PROTONIX) 40 MG tablet; Take 1 tablet (40 mg total) by mouth daily.  Dispense: 30 tablet; Refill: 2 - Ambulatory referral to Gastroenterology - H. pylori breath test Education on GERD presented.  Encouraged to avoid fatty fried foods spicy foods alcohol.  Avoid laying down immediately after eating Patient referred to GI       Relevant Medications   pantoprazole (PROTONIX) 40 MG tablet   Other Relevant Orders   Ambulatory referral to Gastroenterology   H. pylori breath test   Esophageal dysphagia    Chronic condition Encouraged to chew food well before swallowing to prevent choking Patient referred to GI        Other   Screening for colon cancer   Relevant Medications   pantoprazole (PROTONIX) 40 MG tablet   Other Relevant Orders   Ambulatory referral to Gastroenterology   Family history of uterine cancer    Her mom recently died of uterine cancer at age 19 Patient referred for genetic testing Sees obgyn for cervical cancer screening  Reports that she is Up-to-date with mammogram and Pap      Relevant Orders   Ambulatory referral to Genetics    Meds ordered this encounter  Medications   pantoprazole (PROTONIX) 40 MG tablet    Sig: Take 1 tablet (40 mg total) by mouth daily.    Dispense:  30 tablet    Refill:  2    Return in about 3 months (around 03/12/2023) for CPE.  Donell Beers, FNP

## 2022-12-10 NOTE — Assessment & Plan Note (Signed)
Her mom recently died of uterine cancer at age 51 Patient referred for genetic testing Sees obgyn for cervical cancer screening  Reports that she is Up-to-date with mammogram and Pap

## 2022-12-10 NOTE — Patient Instructions (Signed)
1. Gastroesophageal reflux disease, unspecified whether esophagitis present - pantoprazole (PROTONIX) 40 MG tablet; Take 1 tablet (40 mg total) by mouth daily.  Dispense: 30 tablet; Refill: 2 - Ambulatory referral to Gastroenterology - H. pylori breath test  2. Screening for colon cancer  - pantoprazole (PROTONIX) 40 MG tablet; Take 1 tablet (40 mg total) by mouth daily.  Dispense: 30 tablet; Refill: 2 - Ambulatory referral to Gastroenterology  3. Family history of uterine cancer  - Ambulatory referral to Genetics    It is important that you exercise regularly at least 30 minutes 5 times a week as tolerated  Think about what you will eat, plan ahead. Choose " clean, green, fresh or frozen" over canned, processed or packaged foods which are more sugary, salty and fatty. 70 to 75% of food eaten should be vegetables and fruit. Three meals at set times with snacks allowed between meals, but they must be fruit or vegetables. Aim to eat over a 12 hour period , example 7 am to 7 pm, and STOP after  your last meal of the day. Drink water,generally about 64 ounces per day, no other drink is as healthy. Fruit juice is best enjoyed in a healthy way, by EATING the fruit.  [facility] for choosing [facility] we consider it a privelige to serve you.

## 2022-12-11 ENCOUNTER — Other Ambulatory Visit (HOSPITAL_COMMUNITY): Payer: Self-pay

## 2022-12-12 ENCOUNTER — Other Ambulatory Visit: Payer: Self-pay | Admitting: Medical Genetics

## 2022-12-12 DIAGNOSIS — Z006 Encounter for examination for normal comparison and control in clinical research program: Secondary | ICD-10-CM

## 2022-12-12 LAB — H. PYLORI BREATH TEST: H pylori Breath Test: NEGATIVE

## 2023-01-09 ENCOUNTER — Telehealth: Payer: Self-pay

## 2023-01-09 NOTE — Telephone Encounter (Signed)
Received FMLA form from [employer]. Fax received 12/25/22. Reached out to patient for clarification bc it looks like an FMLA form was just completed at the end of July. Had to leave a voicemail requesting a call back.

## 2023-01-10 ENCOUNTER — Other Ambulatory Visit (HOSPITAL_COMMUNITY): Payer: Self-pay | Admitting: Gastroenterology

## 2023-01-10 DIAGNOSIS — Z1211 Encounter for screening for malignant neoplasm of colon: Secondary | ICD-10-CM | POA: Diagnosis not present

## 2023-01-10 DIAGNOSIS — R1311 Dysphagia, oral phase: Secondary | ICD-10-CM

## 2023-01-10 DIAGNOSIS — R1013 Epigastric pain: Secondary | ICD-10-CM

## 2023-01-14 ENCOUNTER — Telehealth: Payer: Self-pay

## 2023-01-14 NOTE — Telephone Encounter (Signed)
FMLA paperwork signed, completed, placed in medical record box

## 2023-01-14 NOTE — Telephone Encounter (Signed)
Completed paperwork and placed in providers office for review and signature.

## 2023-01-14 NOTE — Telephone Encounter (Signed)
Spoke with patient, had [employer] form filled out in July and now [facility] is requiring [employer] form be completed. Brought to POD 2 for completion - OK to waive form fee per review

## 2023-01-15 NOTE — Telephone Encounter (Signed)
Forms faxed and confirmation received. Placed in medical records box for filing

## 2023-01-22 ENCOUNTER — Other Ambulatory Visit (HOSPITAL_COMMUNITY): Payer: Self-pay

## 2023-01-22 ENCOUNTER — Other Ambulatory Visit: Payer: Self-pay | Admitting: Nurse Practitioner

## 2023-01-22 DIAGNOSIS — K219 Gastro-esophageal reflux disease without esophagitis: Secondary | ICD-10-CM

## 2023-01-22 DIAGNOSIS — Z1211 Encounter for screening for malignant neoplasm of colon: Secondary | ICD-10-CM

## 2023-01-22 MED ORDER — PANTOPRAZOLE SODIUM 40 MG PO TBEC
40.0000 mg | DELAYED_RELEASE_TABLET | Freq: Every day | ORAL | 2 refills | Status: DC
Start: 2023-01-22 — End: 2023-04-30
  Filled 2023-01-22: qty 30, 30d supply, fill #0
  Filled 2023-04-24: qty 30, 30d supply, fill #1

## 2023-01-27 ENCOUNTER — Other Ambulatory Visit (HOSPITAL_COMMUNITY): Payer: Self-pay

## 2023-01-29 ENCOUNTER — Encounter (HOSPITAL_COMMUNITY): Payer: Self-pay

## 2023-01-29 ENCOUNTER — Telehealth: Payer: Self-pay | Admitting: Neurology

## 2023-01-29 ENCOUNTER — Ambulatory Visit (HOSPITAL_COMMUNITY)
Admission: RE | Admit: 2023-01-29 | Discharge: 2023-01-29 | Disposition: A | Discharge status: Home / Self Care | Payer: 59 | Source: Ambulatory Visit | Attending: Gastroenterology | Admitting: Gastroenterology

## 2023-01-29 DIAGNOSIS — R1013 Epigastric pain: Secondary | ICD-10-CM | POA: Insufficient documentation

## 2023-01-29 DIAGNOSIS — R1311 Dysphagia, oral phase: Secondary | ICD-10-CM | POA: Insufficient documentation

## 2023-01-29 DIAGNOSIS — K219 Gastro-esophageal reflux disease without esophagitis: Secondary | ICD-10-CM | POA: Diagnosis not present

## 2023-01-29 NOTE — Telephone Encounter (Signed)
Pt has reached out to [facility] , and has been told they never received the referral from  July, 25th.  Pt asking if this can be resent.

## 2023-02-01 ENCOUNTER — Telehealth: Payer: Self-pay | Admitting: Genetic Counselor

## 2023-02-01 NOTE — Telephone Encounter (Signed)
Scheduled appointment per inbasket message. Left voicemail with appointment details as well as the address and phone number.

## 2023-02-10 ENCOUNTER — Other Ambulatory Visit: Payer: Self-pay | Admitting: Nurse Practitioner

## 2023-02-10 ENCOUNTER — Other Ambulatory Visit (HOSPITAL_COMMUNITY): Payer: Self-pay

## 2023-02-10 DIAGNOSIS — K219 Gastro-esophageal reflux disease without esophagitis: Secondary | ICD-10-CM

## 2023-02-10 MED ORDER — SUCRALFATE 1 GM/10ML PO SUSP
1.0000 g | Freq: Three times a day (TID) | ORAL | 1 refills | Status: DC
Start: 2023-02-10 — End: 2023-02-10
  Filled 2023-02-10: qty 420, 11d supply, fill #0

## 2023-02-10 MED ORDER — SUCRALFATE 1 G PO TABS
1.0000 g | ORAL_TABLET | Freq: Four times a day (QID) | ORAL | 1 refills | Status: DC
  Filled 2023-02-10: qty 120, 30d supply, fill #0
  Filled 2023-04-24: qty 120, 30d supply, fill #1

## 2023-02-10 MED ORDER — SUCRALFATE 1 GM/10ML PO SUSP
1.0000 g | Freq: Four times a day (QID) | ORAL | 1 refills | Status: DC
Start: 2023-02-10 — End: 2023-02-10
  Filled 2023-02-10: qty 420, 11d supply, fill #0

## 2023-02-11 ENCOUNTER — Encounter: Payer: Self-pay | Admitting: Neurology

## 2023-02-11 ENCOUNTER — Other Ambulatory Visit: Payer: Self-pay

## 2023-02-11 ENCOUNTER — Other Ambulatory Visit (HOSPITAL_COMMUNITY): Payer: Self-pay

## 2023-02-11 DIAGNOSIS — G500 Trigeminal neuralgia: Secondary | ICD-10-CM

## 2023-02-12 NOTE — Addendum Note (Signed)
Addended by: Glean Salvo on: 02/12/2023 12:24 PM   Modules accepted: Orders

## 2023-02-13 ENCOUNTER — Telehealth: Payer: Self-pay | Admitting: Neurology

## 2023-02-13 NOTE — Telephone Encounter (Signed)
Referral for neurosurgery fax to [facility]. Phone: [phone], Fax: [phone]

## 2023-03-03 ENCOUNTER — Inpatient Hospital Stay: Payer: 59

## 2023-03-03 ENCOUNTER — Inpatient Hospital Stay: Payer: 59 | Attending: Nurse Practitioner | Admitting: Genetic Counselor

## 2023-03-03 DIAGNOSIS — Z8049 Family history of malignant neoplasm of other genital organs: Secondary | ICD-10-CM | POA: Diagnosis not present

## 2023-03-03 NOTE — Progress Notes (Unsigned)
REFERRING PROVIDER: Donell Beers, FNP [phone] [address] [place],  [place] [address]   PRIMARY PROVIDER:  Ivonne Andrew, NP  PRIMARY REASON FOR VISIT:  Encounter Diagnosis  Name Primary?   Family history of uterine cancer Yes     HISTORY OF PRESENT ILLNESS:   Tara Gardner, a 52 y.o. female, was seen for a [place] cancer genetics consultation at the request of Delfino Lovett, FNP due to a family history of uterine cancer.  Tara Gardner presents to clinic today to discuss the possibility of a hereditary predisposition to cancer, to discuss genetic testing, and to further clarify her future cancer risks, as well as potential cancer risks for family members.   Tara Gardner is a 52 y.o. female with no personal history of cancer.  She is being monitored by endocrinology for a pituitary tumor, diagnosed in 2009.   CANCER HISTORY:  Oncology History   No history exists.    RISK FACTORS:  Number of breast biopsies: 0. Breast density information not available.  Colonoscopy: no; not examined. History of H. pylori.  Hysterectomy: no.  Ovaries intact: yes.  Menarche was at age 31.  First live birth at age 73.  Menopausal status: premenopausal.  OCP use for approximately  2  years.  HRT use: 0 years. Gamma knife protocol in August 2020.  Dermatology screening: no  Past Medical History:  Diagnosis Date   Pituitary tumor    Sciatica    Trigeminal neuralgia     Past Surgical History:  Procedure Laterality Date   CESAREAN SECTION     x 1   CHOLECYSTECTOMY  2009      FAMILY HISTORY:  We obtained a detailed, 4-generation family history.  Significant diagnoses are listed below: Family History  Problem Relation Age of Onset   Uterine cancer Mother 47      Tara Gardner is unaware of previous family history of genetic testing for hereditary cancer risks. There is no reported Ashkenazi Jewish ancestry. There is no known consanguinity.  GENETIC COUNSELING  ASSESSMENT: Tara Gardner is a 52 y.o. female with a family history of uterine which not highly suggestive of a hereditary cancer syndrome. We, therefore, discussed and recommended the following at today's visit.   DISCUSSION:   We discussed that, in general, most cancer is not inherited in families, but instead is sporadic or familial. Sporadic cancers occur by chance and typically happen at older ages (>50 years) as this type of cancer is caused by genetic changes acquired during an individual's lifetime. Some families have more cancers than would be expected by chance; however, the ages or types of cancer are not consistent with a known genetic mutation or known genetic mutations have been ruled out. This type of familial cancer is thought to be due to a combination of multiple genetic, environmental, hormonal, and lifestyle factors. While this combination of factors likely increases the risk of cancer, the exact source of this risk is not currently identifiable or testable.    We discussed that approximately 5-10% of cancer cancer is hereditary, meaning that it is due to a mutation in a single gene that is passed down from generation to generation in a family. Most hereditary cases of hereditary uterine cancer are associated with Lynch syndrome. We discussed that testing can be beneficial for several reasons, including knowing about other cancer risks, identifying potential screening and risk-reduction options that may be appropriate, and to understand if other family members could be at risk  for cancer and allow them to undergo genetic testing.  We discussed with Tara Gardner that the family history does not meet insurance or NCCN criteria for genetic testing and, therefore, is not highly consistent with a familial hereditary cancer syndrome.  We feel she is at low risk to harbor a gene mutation associated with such a condition. Thus, we did not recommend any genetic testing, at this time, and recommended  Tara Gardner continue to follow the cancer screening guidelines given by her primary healthcare provider.  We discussed that genetic testing is available at a self-pay price of $249 if she had a strong desire to proceed with testing.  She felt comfortable with her low risk of a hereditary cancer syndrome at this time and opted to not proceed with testing.   PLAN: Tara Gardner is not pursuing genetic testing at today's visit given she does not meet criteria. We remain available to coordinate genetic testing at any time in the future if needed. We, therefore, recommend Tara Gardner continue to follow the cancer screening guidelines given by her primary healthcare provider.  Lastly, we encouraged Tara Gardner to remain in contact with cancer genetics so that we can continuously update the family history and inform her of any changes in cancer genetics and testing that may be of benefit for this family.   Tara Gardner questions were answered to her satisfaction today. Our contact information was provided should additional questions or concerns arise. Thank you for the referral and allowing [place] to share in the care of your patient.   Daksha Koone M. Rennie Plowman, MS, [facility] Genetic Counselor [email or web address] (P) [phone]   The patient was seen for a total of 40 minutes in face-to-face genetic counseling.  The patient was seen alone.  Drs. Gunnar Bulla and/or Mosetta Putt were available to discuss this case as needed.  _______________________________________________________________________ For Office Staff:  Number of people involved in session: 1 Was an Intern/ student involved with case: yes; [employer] intern Marlane Hatcher conducted session under my direct supervision

## 2023-03-04 ENCOUNTER — Encounter: Payer: Self-pay | Admitting: Genetic Counselor

## 2023-04-02 ENCOUNTER — Other Ambulatory Visit (HOSPITAL_COMMUNITY): Payer: Self-pay

## 2023-04-02 ENCOUNTER — Other Ambulatory Visit: Payer: Self-pay | Admitting: Nurse Practitioner

## 2023-04-02 MED ORDER — TRIAMCINOLONE ACETONIDE 0.5 % EX OINT
1.0000 | TOPICAL_OINTMENT | Freq: Two times a day (BID) | CUTANEOUS | 0 refills | Status: DC
  Filled 2023-04-02: qty 30, 15d supply, fill #0

## 2023-04-04 ENCOUNTER — Other Ambulatory Visit (HOSPITAL_COMMUNITY): Payer: Self-pay

## 2023-04-22 NOTE — Telephone Encounter (Signed)
 Contacted patient notify her [facility] has been trying to contact her. Pt stated, would like referral sent to [facility] here in [place] due to not able to go to  [facility] at this time.  Referral faxed to [facility]. Phone: [phone], Fax: [phone]

## 2023-04-22 NOTE — Telephone Encounter (Signed)
Contacted [facility]; Scheduler stated, have attempted to contact patient to schedule an appt. We also mailed patient a letter.

## 2023-04-24 ENCOUNTER — Other Ambulatory Visit: Payer: Self-pay | Admitting: Nurse Practitioner

## 2023-04-24 DIAGNOSIS — M79605 Pain in left leg: Secondary | ICD-10-CM

## 2023-04-25 ENCOUNTER — Ambulatory Visit (HOSPITAL_COMMUNITY)
Admission: RE | Admit: 2023-04-25 | Discharge: 2023-04-25 | Disposition: A | Discharge status: Home / Self Care | Payer: 59 | Source: Ambulatory Visit | Attending: Nurse Practitioner

## 2023-04-25 DIAGNOSIS — M7989 Other specified soft tissue disorders: Secondary | ICD-10-CM | POA: Diagnosis not present

## 2023-04-25 DIAGNOSIS — M79605 Pain in left leg: Secondary | ICD-10-CM

## 2023-04-29 ENCOUNTER — Other Ambulatory Visit: Payer: Self-pay

## 2023-04-29 ENCOUNTER — Other Ambulatory Visit (HOSPITAL_BASED_OUTPATIENT_CLINIC_OR_DEPARTMENT_OTHER): Payer: Self-pay

## 2023-04-29 ENCOUNTER — Other Ambulatory Visit (HOSPITAL_COMMUNITY): Payer: Self-pay

## 2023-04-29 DIAGNOSIS — R21 Rash and other nonspecific skin eruption: Secondary | ICD-10-CM

## 2023-04-29 MED ORDER — TRIAMCINOLONE ACETONIDE 0.5 % EX OINT
1.0000 | TOPICAL_OINTMENT | Freq: Two times a day (BID) | CUTANEOUS | 0 refills | Status: DC
  Filled 2023-04-29: qty 30, 15d supply, fill #0

## 2023-04-30 ENCOUNTER — Other Ambulatory Visit: Payer: Self-pay | Admitting: Nurse Practitioner

## 2023-04-30 ENCOUNTER — Other Ambulatory Visit: Payer: Self-pay

## 2023-04-30 ENCOUNTER — Other Ambulatory Visit (HOSPITAL_COMMUNITY): Payer: Self-pay

## 2023-04-30 ENCOUNTER — Other Ambulatory Visit (HOSPITAL_BASED_OUTPATIENT_CLINIC_OR_DEPARTMENT_OTHER): Payer: Self-pay

## 2023-04-30 DIAGNOSIS — Z1211 Encounter for screening for malignant neoplasm of colon: Secondary | ICD-10-CM

## 2023-04-30 DIAGNOSIS — K219 Gastro-esophageal reflux disease without esophagitis: Secondary | ICD-10-CM

## 2023-04-30 MED ORDER — PANTOPRAZOLE SODIUM 40 MG PO TBEC
40.0000 mg | DELAYED_RELEASE_TABLET | Freq: Every day | ORAL | 2 refills | Status: DC
  Filled 2023-04-30: qty 30, 30d supply, fill #0

## 2023-04-30 MED ORDER — SUCRALFATE 1 G PO TABS
1.0000 g | ORAL_TABLET | Freq: Four times a day (QID) | ORAL | 1 refills | Status: DC
  Filled 2023-04-30: qty 120, 30d supply, fill #0

## 2023-05-06 NOTE — Telephone Encounter (Signed)
 [facility] they have the referral,but has not called the patient. Receptionist transferred to referral coordinator;  LVM for referral coordinator to call back,

## 2023-06-05 ENCOUNTER — Ambulatory Visit: Payer: Self-pay | Admitting: Neurology

## 2023-08-16 ENCOUNTER — Other Ambulatory Visit: Payer: Self-pay | Admitting: Nurse Practitioner

## 2023-08-16 DIAGNOSIS — R21 Rash and other nonspecific skin eruption: Secondary | ICD-10-CM

## 2023-08-18 ENCOUNTER — Other Ambulatory Visit (HOSPITAL_COMMUNITY): Payer: Self-pay

## 2023-08-18 MED ORDER — TRIAMCINOLONE ACETONIDE 0.5 % EX OINT
1.0000 | TOPICAL_OINTMENT | Freq: Two times a day (BID) | CUTANEOUS | 0 refills | Status: DC
Start: 2023-08-18 — End: 2023-09-03
  Filled 2023-08-18: qty 30, 15d supply, fill #0

## 2023-08-18 NOTE — Telephone Encounter (Signed)
 Please advise [facility]

## 2023-08-28 ENCOUNTER — Other Ambulatory Visit (HOSPITAL_COMMUNITY): Payer: Self-pay

## 2023-09-03 ENCOUNTER — Other Ambulatory Visit (HOSPITAL_COMMUNITY): Payer: Self-pay

## 2023-09-03 ENCOUNTER — Ambulatory Visit: Payer: Self-pay | Admitting: Neurology

## 2023-09-03 ENCOUNTER — Encounter: Payer: Self-pay | Admitting: Neurology

## 2023-09-03 VITALS — BP 140/78 | HR 81 | Ht 67.0 in | Wt 277.5 lb

## 2023-09-03 DIAGNOSIS — G500 Trigeminal neuralgia: Secondary | ICD-10-CM

## 2023-09-03 MED ORDER — GABAPENTIN 300 MG PO CAPS
300.0000 mg | ORAL_CAPSULE | Freq: Three times a day (TID) | ORAL | 5 refills | Status: DC
  Filled 2023-09-03: qty 90, 30d supply, fill #0
  Filled ????-??-??: fill #0

## 2023-09-03 NOTE — Patient Instructions (Signed)
 Great to see you today.  Try taking gabapentin  300 mg, 3 tablets at bedtime.  We can add back in Oxtellar if needed.  Follow-up with [facility] about referral that was sent in.  I will see you in 6 months.  Reach out via MyChart if needed sooner.  Thanks!!

## 2023-09-03 NOTE — Progress Notes (Signed)
 Chief Complaint  Patient presents with   Trigeminal Neuralgia    Rm14, alone  LEFT SIDED TRIGEMINAL NEURALGIA: pt states she is still taking her gaba pentin and it is helping some. Pt states feels pain everyday for the last 30 days on pain scale of 8. Gaba pentin helps some but does not make it go away completely. Pt states gets worse at night time. Heat helps, ice numbs it.    ASSESSMENT AND PLAN  KENDELLE PIRKL is a 53 y.o. female   1.  Left trigeminal neuralgia -Return of pain, gamma knife August 2022 at [facility], pain mostly in remission until January 2024 -Has active referral at [facility] for evaluation of intervention options for trigeminal neuralgia -Continue gabapentin  300 mg, can take 3 tablets at bedtime, pain is worse at night, doesn't take during the day  -May consider restart Oxtellar XR  300 mg at bedtime -We will renew paperwork, emailed to Abran Abrahams - Follow-up in 6 months virtually, reach out via MyChart sooner if needed  DIAGNOSTIC DATA (LABS, IMAGING, TESTING) - I reviewed patient records, labs, notes, testing and imaging myself where available.  MEDICAL HISTORY:  CHARLISSA BRYAND is a 53 years old right-handed female, seen in refer by  her primary care nurse practitioner Nita Bast, for evaluation of jaw pain, initial evaluation was on November 18 2016.   I reviewed and summarized the referring note she had a history of left jaw pain since 2015, initially she thought it was her left lower tooth disease, actually had left wisdom teeth pulled,followed by multiple dental examination, there was no significant abnormality found,   She described pain was a bruise sensation at the left jaw area, radiating to left ear, sharp radiating transient, she was treated with gabapentin  300 mg twice a day, which has helped her symptoms until January 2018, despite regular dose of gabapentin , she continue complains of recurrent left jaw pain, difficulty eating, skin sensitivity at the left upper  and lower lips, sometimes triggered to become a headache,   She also reported history of pituitary tumor in 2009,she presented with frequent headache blurry vision , was seen by endocrinologist,no treatment was needed.   UPDATE Feb 03 2018: She continues to have intermittent left facial pain, mostly at nighttime, despite taking Trileptal  150 mg twice a day, gabapentin  300 mg 3 times a day,  Couple night a month, she woke up at  night with left facial shooting pain, difficulty falling into sleep.    Lab in July 2018 showed normal or negative A1c 5.6, ESR, CRP, TSH, RPR, B12, CMP, CBC.   UPDATE October 28 2018: Her left trigeminal pain was under good control from October 2019 to April 2020, then she began to have frequent flareup of radiating pain from left ear to left upper jaw, sharp transient, frequent occurrence, it is hard for her to talk on the phone, despite titrating dose of Trileptal  XR 300 mg every night, gabapentin  300 mg 2 tablets 3 times a day.  She denies significant side effect from medications   Laboratory evaluation in October 2019: Gabapentin  level was 1.4, normal CMP, CBC hemoglobin of 12.2, Trileptal  level was 3   UPDATE Apr 19 2021: She has gamma knife on August 26th 2022.  Left facial pain has much improved, but continues to have intermittent involvement of left jaw more than left maxillary region, she continued on gabapentin  300 mg 3 times daily, Oxtellar XR  300 mg every night  Update November 14, 2022 SS: Has  not been seen since December 2022. Gamma knife in August 2022, did well up until January 2024, it never completely resolved, but worsening now. 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 Flares at night, left sided V2-3. [facility] has point system, has been late for work, takes gabapentin  300 mg 2 at night, has not gone back to Oxtellar XR . Has FMLA papers, wants to avoid points. Stress with her mother who has cancer.   Update Sep 03, 2023 SS: Refer to [place]  [facility] at last visit Dr. Ali Antonio, he is no longer there, referral sent to [facility], she couldn't go at that time. 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 Remains on gabapentin , usually just 2 at night. TN severity varies.   PHYSICAL EXAM:   Vitals:   09/03/23 0913  BP: (!) 140/78  Pulse: 81  Weight: 277 lb 8 oz (125.9 kg)  Height: 5\' 7"  (1.702 m)    Not recorded    Body mass index is 43.46 kg/m.  PHYSICAL EXAMNIATION:  Gen: NAD, conversant, well nourised, well groomed         NEUROLOGICAL EXAM:  MENTAL STATUS: Speech/cognition: Awake, alert, oriented to history taking entire conversation   CRANIAL NERVES: CN II: Visual fields are full to confrontation. Pupils are round equal and briskly reactive to light. CN III, IV, VI: extraocular movement are normal. No ptosis.  CN V: Facial sensation is intact to light touch,  CN VII: Face is symmetric with normal eye closure  CN VIII: Hearing is normal to causal conversation. CN IX, X: Phonation is normal. CN XI: Head turning and shoulder shrug are intact  MOTOR: There is no pronator drift of out-stretched arms. Muscle bulk and tone are normal. Muscle strength is normal.  REFLEXES: Reflexes are 2+ and symmetric at the biceps, triceps, knees, and ankles. Plantar responses are flexor.  SENSORY: Intact to light touch  COORDINATION: There is no trunk or limb dysmetria noted.  GAIT/STANCE: Posture is normal. Gait is steady with normal steps  REVIEW OF SYSTEMS:  Full 14 system review of systems performed and notable only for as above All other review of systems were negative.   ALLERGIES: Allergies  Allergen Reactions   Penicillins Other (See Comments)    Childhood allergy - unsure of reaction.    HOME  MEDICATIONS: Current Outpatient Medications  Medication Sig Dispense Refill   gabapentin  (NEURONTIN ) 300 MG capsule Take 1 capsule (300 mg total) by mouth 3 (three) times daily. 90 capsule 5   No current facility-administered medications for this visit.    PAST MEDICAL HISTORY: Past Medical History:  Diagnosis Date   Pituitary tumor    Sciatica    Trigeminal neuralgia     PAST SURGICAL HISTORY: Past Surgical History:  Procedure Laterality Date   CESAREAN SECTION     x 1   CHOLECYSTECTOMY  2009    FAMILY HISTORY: Family History  Problem Relation Age of Onset   Hypertension Mother    Diabetes Mother    Anxiety disorder Mother    Uterine cancer Mother 42   Heart attack Father 19    SOCIAL HISTORY: Social History   Socioeconomic History   Marital status: Married    Spouse name: eulias   Number  of children: 4   Years of education: Some college   Highest education level: Some college, no degree  Occupational History   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   Right-handed.   Occasionally caffeine use.   Social Drivers of Corporate investment banker Strain: Not on file  Food Insecurity: Not on file  Transportation Needs: Not on file  Physical Activity: Not on file  Stress: Not on file  Social Connections: Not on file  Intimate Partner Violence: Not on file   Jeanmarie Millet, Maritza Sidles, DNP  [facility] [address], [address] [place], [place] [address] [phone]

## 2023-09-10 ENCOUNTER — Telehealth: Payer: Self-pay | Admitting: Neurology

## 2023-09-10 NOTE — Telephone Encounter (Signed)
 Received completed accommodation forms for patient. Faxed to [facility] [phone]. Emailed copy to patient

## 2023-09-25 ENCOUNTER — Telehealth: Payer: Self-pay | Admitting: Nurse Practitioner

## 2023-09-25 ENCOUNTER — Other Ambulatory Visit (HOSPITAL_COMMUNITY): Payer: Self-pay

## 2023-09-25 ENCOUNTER — Encounter: Payer: Self-pay | Admitting: Nurse Practitioner

## 2023-09-25 VITALS — Wt 277.0 lb

## 2023-09-25 DIAGNOSIS — R1011 Right upper quadrant pain: Secondary | ICD-10-CM

## 2023-09-25 MED ORDER — PANTOPRAZOLE SODIUM 40 MG PO TBEC
40.0000 mg | DELAYED_RELEASE_TABLET | Freq: Every day | ORAL | 3 refills | Status: AC
Start: 2023-09-25 — End: ?
  Filled 2023-09-25: qty 30, 30d supply, fill #0

## 2023-09-25 NOTE — Progress Notes (Signed)
 Virtual Visit via Telephone Note  I connected with Tara Gardner on 09/25/23 at  1:20 PM EDT by telephone and verified that I am speaking with the correct person using two identifiers.  Location: Patient: home Provider: office   I discussed the limitations, risks, security and privacy concerns of performing an evaluation and management service by telephone and the availability of in person appointments. I also discussed with the patient that there may be a patient responsible charge related to this service. The patient expressed understanding and agreed to proceed.   History of Present Illness:  Patient presents today for an acute visit through telephone visit.  She states for the past few days she has been having right upper quadrant pain especially at night.  She does have a history of cholecystectomy.  We will order ultrasound.  Patient was previously on omeprazole and we will start Protonix  today.  Patient is followed by GI. Denies f/c/s, n/v/d, hemoptysis, PND, leg swelling Denies chest pain or edema     Observations/Objective:     09/25/2023    2:17 PM 09/03/2023    9:13 AM 12/10/2022    2:40 PM  Vitals with BMI  Height  5\' 7"    Weight 277 lbs 277 lbs 8 oz 265 lbs  BMI  43.45   Systolic  140 117  Diastolic  78 71  Pulse  81 66      Assessment and Plan:  1. Right upper quadrant abdominal pain (Primary)  - pantoprazole  (PROTONIX ) 40 MG tablet; Take 1 tablet (40 mg total) by mouth daily.  Dispense: 30 tablet; Refill: 3 - US  Abdomen Limited RUQ (LIVER/GB); Future - CBC; Future - Comprehensive metabolic panel with GFR; Future - Amylase; Future - Lipase; Future  Follow up:  Follow up as needed   I discussed the assessment and treatment plan with the patient. The patient was provided an opportunity to ask questions and all were answered. The patient agreed with the plan and demonstrated an understanding of the instructions.   The patient was advised to call back or  seek an in-person evaluation if the symptoms worsen or if the condition fails to improve as anticipated.  I provided 23 minutes of non-face-to-face time during this encounter.   Jerrlyn Morel, NP

## 2023-10-07 ENCOUNTER — Telehealth: Payer: Self-pay

## 2023-10-07 ENCOUNTER — Other Ambulatory Visit (HOSPITAL_COMMUNITY): Payer: Self-pay

## 2023-10-07 ENCOUNTER — Ambulatory Visit
Admission: RE | Admit: 2023-10-07 | Discharge: 2023-10-07 | Disposition: A | Discharge status: Home / Self Care | Payer: Self-pay | Source: Ambulatory Visit | Attending: Nurse Practitioner | Admitting: Nurse Practitioner

## 2023-10-07 DIAGNOSIS — R1011 Right upper quadrant pain: Secondary | ICD-10-CM

## 2023-10-08 ENCOUNTER — Ambulatory Visit: Payer: Self-pay | Admitting: Nurse Practitioner

## 2023-10-08 NOTE — Telephone Encounter (Signed)
 Sent to provider to add labs

## 2023-10-09 ENCOUNTER — Other Ambulatory Visit: Payer: Self-pay | Admitting: Nurse Practitioner

## 2023-10-09 DIAGNOSIS — R1011 Right upper quadrant pain: Secondary | ICD-10-CM

## 2023-11-21 ENCOUNTER — Telehealth: Payer: Self-pay

## 2023-11-21 NOTE — Telephone Encounter (Signed)
 Sent to provider

## 2024-01-08 ENCOUNTER — Telehealth: Payer: Self-pay

## 2024-01-08 ENCOUNTER — Other Ambulatory Visit: Payer: Self-pay

## 2024-01-08 ENCOUNTER — Other Ambulatory Visit (HOSPITAL_COMMUNITY): Payer: Self-pay

## 2024-01-08 MED ORDER — GABAPENTIN 300 MG PO CAPS
300.0000 mg | ORAL_CAPSULE | Freq: Three times a day (TID) | ORAL | 5 refills | Status: AC
  Filled 2024-01-15 – 2024-01-28 (×2): qty 90, 30d supply, fill #0

## 2024-01-08 NOTE — Telephone Encounter (Signed)
 error

## 2024-01-15 ENCOUNTER — Other Ambulatory Visit (HOSPITAL_COMMUNITY): Payer: Self-pay

## 2024-01-15 ENCOUNTER — Other Ambulatory Visit: Payer: Self-pay

## 2024-01-26 ENCOUNTER — Other Ambulatory Visit (HOSPITAL_COMMUNITY): Payer: Self-pay

## 2024-01-28 ENCOUNTER — Other Ambulatory Visit (HOSPITAL_COMMUNITY): Payer: Self-pay

## 2024-02-19 ENCOUNTER — Other Ambulatory Visit: Payer: Self-pay | Admitting: Medical Genetics

## 2024-02-19 DIAGNOSIS — Z006 Encounter for examination for normal comparison and control in clinical research program: Secondary | ICD-10-CM

## 2024-03-23 ENCOUNTER — Other Ambulatory Visit (HOSPITAL_COMMUNITY): Payer: Self-pay

## 2024-03-25 ENCOUNTER — Other Ambulatory Visit (HOSPITAL_COMMUNITY): Payer: Self-pay

## 2024-05-05 ENCOUNTER — Other Ambulatory Visit: Payer: Self-pay

## 2024-05-05 ENCOUNTER — Telehealth: Payer: Self-pay | Admitting: Neurology

## 2024-05-05 ENCOUNTER — Encounter: Payer: Self-pay | Admitting: Neurology

## 2024-05-05 ENCOUNTER — Encounter: Payer: Self-pay | Admitting: Emergency Medicine

## 2024-05-05 ENCOUNTER — Emergency Department

## 2024-05-05 DIAGNOSIS — G500 Trigeminal neuralgia: Secondary | ICD-10-CM

## 2024-05-05 DIAGNOSIS — M79605 Pain in left leg: Secondary | ICD-10-CM | POA: Insufficient documentation

## 2024-05-05 DIAGNOSIS — M79604 Pain in right leg: Secondary | ICD-10-CM | POA: Insufficient documentation

## 2024-05-05 DIAGNOSIS — R0602 Shortness of breath: Secondary | ICD-10-CM | POA: Insufficient documentation

## 2024-05-05 LAB — CBC
HCT: 33.5 % — ABNORMAL LOW (ref 36.0–46.0)
Hemoglobin: 10.3 g/dL — ABNORMAL LOW (ref 12.0–15.0)
MCH: 25.7 pg — ABNORMAL LOW (ref 26.0–34.0)
MCHC: 30.7 g/dL (ref 30.0–36.0)
MCV: 83.5 fL (ref 80.0–100.0)
Platelets: 334 K/uL (ref 150–400)
RBC: 4.01 MIL/uL (ref 3.87–5.11)
RDW: 16.1 % — ABNORMAL HIGH (ref 11.5–15.5)
WBC: 9.1 K/uL (ref 4.0–10.5)
nRBC: 0 % (ref 0.0–0.2)

## 2024-05-05 NOTE — ED Triage Notes (Signed)
 Patient ambulatory to triage with steady gait, without difficulty or distress noted; pt reports onset symptoms nausea, SHOB, and not feeling well

## 2024-05-05 NOTE — Patient Instructions (Signed)
 Contact [facility] Continue gabapentin  Follow-up in 1 year

## 2024-05-05 NOTE — Progress Notes (Signed)
 "   Virtual Visit via Video Note  I connected with Tara Gardner on 05/05/2024 at  3:30 PM EST by a video enabled telemedicine application and verified that I am speaking with the correct person using two identifiers.  Location: Patient: at her home Provider: in the office    I discussed the limitations of evaluation and management by telemedicine and the availability of in person appointments. The patient expressed understanding and agreed to proceed.  ASSESSMENT AND PLAN  Tara Gardner is a 54 y.o. female   1.  Left trigeminal neuralgia -Return of pain, gamma knife August 2022 at [facility], pain mostly in remission until January 2024 -In the past referral to [facility] neurosurgery, I gave her the phone number to see if referral is still active -Continue gabapentin  300 mg, can take up to 3 tablets at bedtime, pain is worse at night, doesn't take during the day  -May consider restart Oxtellar XR  300 mg at bedtime -We can do her FMLA paperwork, doesn't miss work, but may be late if she didn't sleep because of the pain. 1-2 episodes a month, no more than total missing 8 hours a month  - Follow-up in 1 year virtually  DIAGNOSTIC DATA (LABS, IMAGING, TESTING) - I reviewed patient records, labs, notes, testing and imaging myself where available.  MEDICAL HISTORY:  Tara Gardner is a 54 years old right-handed female, seen in refer by  her primary care nurse practitioner Celestia Harder, for evaluation of jaw pain, initial evaluation was on November 18 2016.   I reviewed and summarized the referring note she had a history of left jaw pain since 2015, initially she thought it was her left lower tooth disease, actually had left wisdom teeth pulled,followed by multiple dental examination, there was no significant abnormality found,   She described pain was a bruise sensation at the left jaw area, radiating to left ear, sharp radiating transient, she was treated with gabapentin  300 mg twice a day, which  has helped her symptoms until January 2018, despite regular dose of gabapentin , she continue complains of recurrent left jaw pain, difficulty eating, skin sensitivity at the left upper and lower lips, sometimes triggered to become a headache,   She also reported history of pituitary tumor in 2009,she presented with frequent headache blurry vision , was seen by endocrinologist,no treatment was needed.   UPDATE Feb 03 2018: She continues to have intermittent left facial pain, mostly at nighttime, despite taking Trileptal  150 mg twice a day, gabapentin  300 mg 3 times a day,  Couple night a month, she woke up at  night with left facial shooting pain, difficulty falling into sleep.    Lab in July 2018 showed normal or negative A1c 5.6, ESR, CRP, TSH, RPR, B12, CMP, CBC.   UPDATE October 28 2018: Her left trigeminal pain was under good control from October 2019 to April 2020, then she began to have frequent flareup of radiating pain from left ear to left upper jaw, sharp transient, frequent occurrence, it is hard for her to talk on the phone, despite titrating dose of Trileptal  XR 300 mg every night, gabapentin  300 mg 2 tablets 3 times a day.  She denies significant side effect from medications   Laboratory evaluation in October 2019: Gabapentin  level was 1.4, normal CMP, CBC hemoglobin of 12.2, Trileptal  level was 3   UPDATE Apr 19 2021: She has gamma knife on August 26th 2022.  Left facial pain has much improved, but continues to  have intermittent involvement of left jaw more than left maxillary region, she continued on gabapentin  300 mg 3 times daily, Oxtellar XR  300 mg every night  Update November 14, 2022 SS: Has not been seen since December 2022. Gamma knife in August 2022, did well up until January 2024, it never completely resolved, but worsening now. 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 Flares at night, left sided V2-3. [facility] has point system, has been late for work, takes  gabapentin  300 mg 2 at night, has not gone back to Oxtellar XR . Has FMLA papers, wants to avoid points. Stress with her mother who has cancer.   Update Sep 03, 2023 SS: Refer to [facility] at last visit Dr. Cheryle, he is no longer there, referral sent to [facility], she couldn't go at that time. 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 Remains on gabapentin , usually just 2 at night. TN severity varies.   Update 05/05/24 SS: Previously referred to [facility] for TN, didn't get setup last year due to insurance issue. Hoping to go this year. Remains on gabapentin  300, 1-2 capsules daily. Worsening pain around her menstrual cycle. Still working from phone, on the phone all day.   PHYSICAL EXAM:  Virtual Visit   REVIEW OF SYSTEMS:  Full 14 system review of systems performed and notable only for as above All other review of systems were negative.   ALLERGIES: Allergies  Allergen Reactions   Penicillins Other (See Comments)    Childhood allergy - unsure of reaction.    HOME MEDICATIONS: Current Outpatient Medications  Medication Sig Dispense Refill   gabapentin  (NEURONTIN ) 300 MG capsule Take 1 capsule (300 mg total) by mouth 3 (three) times daily. 90 capsule 5   pantoprazole  (PROTONIX ) 40 MG tablet Take 1 tablet (40 mg total) by mouth daily. 30 tablet 3   No current facility-administered medications for this visit.    PAST MEDICAL HISTORY: Past Medical History:  Diagnosis Date   Pituitary tumor    Sciatica    Trigeminal neuralgia     PAST SURGICAL HISTORY: Past Surgical History:  Procedure Laterality Date   CESAREAN SECTION     x 1   CHOLECYSTECTOMY  2009    FAMILY HISTORY: Family History  Problem Relation Age of Onset   Hypertension Mother    Diabetes  Mother    Anxiety disorder Mother    Uterine cancer Mother 19   Heart attack Father 81    SOCIAL HISTORY: Social History   Socioeconomic History   Marital status: Married    Spouse name: eulias   Number of children: 4   Years of education: Some college   Highest education level: Some college, no degree  Occupational History   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   Right-handed.   Occasionally caffeine use.   Social Drivers of Health   Tobacco Use: Low Risk (09/25/2023)   Patient History    Smoking Tobacco Use: Never    Smokeless Tobacco Use: Never    Passive Exposure: Not on file  Financial Resource Strain: Not on file  Food Insecurity: Not on file  Transportation Needs: Not on file  Physical Activity: Not on file  Stress: Not on file  Social Connections: Not on file  Intimate Partner Violence: Not on file  Depression (EYV7-0): Not on file  Alcohol Screen: Not on file  Housing: Not on file  Utilities: Not on file  Health Literacy: Not on file   Tara Gardner, SCHARLENE, DNP  [facility] [address] [place], [place] [address] [phone]  "

## 2024-05-06 ENCOUNTER — Telehealth: Payer: Self-pay | Admitting: Neurology

## 2024-05-06 ENCOUNTER — Emergency Department
Admission: EM | Admit: 2024-05-06 | Discharge: 2024-05-06 | Disposition: A | Discharge status: Home / Self Care | Attending: Emergency Medicine | Admitting: Emergency Medicine

## 2024-05-06 DIAGNOSIS — R0602 Shortness of breath: Secondary | ICD-10-CM

## 2024-05-06 DIAGNOSIS — M79604 Pain in right leg: Secondary | ICD-10-CM

## 2024-05-06 LAB — RESP PANEL BY RT-PCR (RSV, FLU A&B, COVID)  RVPGX2
Influenza A by PCR: NEGATIVE
Influenza B by PCR: NEGATIVE
Resp Syncytial Virus by PCR: NEGATIVE
SARS Coronavirus 2 by RT PCR: NEGATIVE

## 2024-05-06 LAB — COMPREHENSIVE METABOLIC PANEL WITH GFR
ALT: 15 U/L (ref 0–44)
AST: 18 U/L (ref 15–41)
Albumin: 4.2 g/dL (ref 3.5–5.0)
Alkaline Phosphatase: 91 U/L (ref 38–126)
Anion gap: 10 (ref 5–15)
BUN: 11 mg/dL (ref 6–20)
CO2: 26 mmol/L (ref 22–32)
Calcium: 8.8 mg/dL — ABNORMAL LOW (ref 8.9–10.3)
Chloride: 104 mmol/L (ref 98–111)
Creatinine, Ser: 0.82 mg/dL (ref 0.44–1.00)
GFR, Estimated: >60 mL/min
Glucose, Bld: 115 mg/dL — ABNORMAL HIGH (ref 70–99)
Potassium: 3.7 mmol/L (ref 3.5–5.1)
Sodium: 140 mmol/L (ref 135–145)
Total Bilirubin: <0.2 mg/dL (ref 0.0–1.2)
Total Protein: 6.8 g/dL (ref 6.5–8.1)

## 2024-05-06 LAB — TROPONIN T, HIGH SENSITIVITY
Troponin T High Sensitivity: <15 ng/L (ref 0–19)
Troponin T High Sensitivity: <15 ng/L (ref 0–19)

## 2024-05-06 LAB — POC URINE PREG, ED: Preg Test, Ur: NEGATIVE

## 2024-05-06 LAB — URINALYSIS, ROUTINE W REFLEX MICROSCOPIC
Bacteria, UA: NONE SEEN
Bilirubin Urine: NEGATIVE
Glucose, UA: NEGATIVE mg/dL
Ketones, ur: NEGATIVE mg/dL
Leukocytes,Ua: NEGATIVE
Nitrite: NEGATIVE
Protein, ur: NEGATIVE mg/dL
Specific Gravity, Urine: 1.015 (ref 1.005–1.030)
pH: 6 (ref 5.0–8.0)

## 2024-05-06 LAB — MAGNESIUM: Magnesium: 2.2 mg/dL (ref 1.7–2.4)

## 2024-05-06 LAB — T4, FREE: Free T4: 1.06 ng/dL (ref 0.80–2.00)

## 2024-05-06 LAB — LIPASE, BLOOD: Lipase: 39 U/L (ref 11–51)

## 2024-05-06 LAB — D-DIMER, QUANTITATIVE: D-Dimer, Quant: 0.43 ug{FEU}/mL (ref 0.00–0.50)

## 2024-05-06 LAB — TSH: TSH: 3.77 u[IU]/mL (ref 0.350–4.500)

## 2024-05-06 NOTE — Telephone Encounter (Signed)
 Referral To Neurosurgery faxed to [facility]  [facility] Phone: [phone] Fax: [phone]

## 2024-05-06 NOTE — ED Provider Notes (Signed)
 "  [facility] Provider Note    Event Date/Time   First MD Initiated Contact with Patient 05/06/24 [phone]     (approximate)   History   Shortness of Breath   HPI  Tara Gardner is a 54 y.o. female with history of pituitary tumor, trigeminal neuralgia who presents to the emergency department with sudden onset of discomfort in her legs that then radiated up to her chest.  States she felt very flushed, short of breath and abnormal and told her husband that something was wrong.  She states she started to feel very panicky and could not get her symptoms to improve.  She denies fevers, cough, congestion, vomiting or diarrhea.  States she is starting to feel better.  No prior history of PE or DVT.   History provided by patient, husband.    Past Medical History:  Diagnosis Date   Pituitary tumor    Sciatica    Trigeminal neuralgia     Past Surgical History:  Procedure Laterality Date   CESAREAN SECTION     x 1   CHOLECYSTECTOMY  2009    MEDICATIONS:  Prior to Admission medications  Medication Sig Start Date End Date Taking? Authorizing Provider  gabapentin  (NEURONTIN ) 300 MG capsule Take 1 capsule (300 mg total) by mouth 3 (three) times daily. 01/08/24   Oley Bascom RAMAN, NP  pantoprazole  (PROTONIX ) 40 MG tablet Take 1 tablet (40 mg total) by mouth daily. 09/25/23   Oley Bascom RAMAN, NP    Physical Exam   Triage Vital Signs: ED Triage Vitals  Encounter Vitals Group     BP 05/05/24 2334 121/73     Girls Systolic BP Percentile --      Girls Diastolic BP Percentile --      Boys Systolic BP Percentile --      Boys Diastolic BP Percentile --      Pulse Rate 05/05/24 2334 93     Resp 05/05/24 2334 17     Temp 05/05/24 2334 97.9 F (36.6 C)     Temp Source 05/05/24 2334 Oral     SpO2 05/05/24 2334 96 %     Weight 05/05/24 2319 270 lb (122.5 kg)     Height 05/05/24 2319 5' 7 (1.702 m)     Head Circumference --      Peak Flow --      Pain Score  05/05/24 2320 0     Pain Loc --      Pain Education --      Exclude from Growth Chart --     Most recent vital signs: Vitals:   05/05/24 2334  BP: 121/73  Pulse: 93  Resp: 17  Temp: 97.9 F (36.6 C)  SpO2: 96%    CONSTITUTIONAL: Alert, responds appropriately to questions. Well-appearing; well-nourished HEAD: Normocephalic, atraumatic EYES: Conjunctivae clear, pupils appear equal, sclera nonicteric ENT: normal nose; moist mucous membranes NECK: Supple, normal ROM CARD: RRR; S1 and S2 appreciated RESP: Normal chest excursion without splinting or tachypnea; breath sounds clear and equal bilaterally; no wheezes, no rhonchi, no rales, no hypoxia or respiratory distress, speaking full sentences ABD/GI: Non-distended; soft, non-tender, no rebound, no guarding, no peritoneal signs BACK: The back appears normal EXT: Normal ROM in all joints; no deformity noted, no edema, no calf tenderness or calf swelling SKIN: Normal color for age and race; warm; no rash on exposed skin NEURO: Moves all extremities equally, normal speech, no facial asymmetry, normal gait PSYCH: The patient's  mood and manner are appropriate.   ED Results / Procedures / Treatments   LABS: (all labs ordered are listed, but only abnormal results are displayed) Labs Reviewed  URINALYSIS, ROUTINE W REFLEX MICROSCOPIC - Abnormal; Notable for the following components:      Result Value   Color, Urine YELLOW (*)    APPearance CLEAR (*)    Hgb urine dipstick MODERATE (*)    All other components within normal limits  CBC - Abnormal; Notable for the following components:   Hemoglobin 10.3 (*)    HCT 33.5 (*)    MCH 25.7 (*)    RDW 16.1 (*)    All other components within normal limits  COMPREHENSIVE METABOLIC PANEL WITH GFR - Abnormal; Notable for the following components:   Glucose, Bld 115 (*)    Calcium 8.8 (*)    All other components within normal limits  RESP PANEL BY RT-PCR (RSV, FLU A&B, COVID)  RVPGX2   LIPASE, BLOOD  D-DIMER, QUANTITATIVE  T4, FREE  MAGNESIUM  TSH  POC URINE PREG, ED  TROPONIN T, HIGH SENSITIVITY  TROPONIN T, HIGH SENSITIVITY     EKG:  EKG Interpretation Date/Time:  Wednesday May 05 2024 23:27:05 EST Ventricular Rate:  93 PR Interval:  156 QRS Duration:  90 QT Interval:  366 QTC Calculation: 455 R Axis:   -23  Text Interpretation: Sinus rhythm with occasional Premature ventricular complexes Otherwise normal ECG When compared with ECG of 15-Aug-2009 17:36, Premature ventricular complexes are now Present Confirmed by Neomi Neptune [phone]) on 05/06/2024 3:06:15 AM         RADIOLOGY: My personal review and interpretation of imaging: Chest x-ray clear.  I have personally reviewed all radiology reports.   DG Chest 2 View Result Date: 05/05/2024 CLINICAL DATA:  Shortness of breath EXAM: CHEST - 2 VIEW COMPARISON:  10/29/2007 FINDINGS: The heart size and mediastinal contours are within normal limits. Both lungs are clear. The visualized skeletal structures are unremarkable. IMPRESSION: No active cardiopulmonary disease. Electronically Signed   By: Luke Bun M.D.   On: 05/05/2024 23:47     PROCEDURES:  Critical Care performed: No    Procedures    IMPRESSION / MDM / ASSESSMENT AND PLAN / ED COURSE  I reviewed the triage vital signs and the nursing notes.    Patient here with complaints of leg discomfort that radiated to the chest and not feeling right.  Symptoms are improving.    DIFFERENTIAL DIAGNOSIS (includes but not limited to):   Anxiety, ACS, arrhythmia, anemia, electrolyte derangement, thyroid  dysfunction, UTI, dehydration, viral illness, pneumonia, PE, doubt dissection   Patient's presentation is most consistent with acute presentation with potential threat to life or bodily function.   PLAN: Will obtain labs, urine, chest x-ray.  She declines anything for pain at this time.  Will also check for COVID and flu given high volumes  in her community recently.   MEDICATIONS GIVEN IN ED: Medications - No data to display   ED COURSE: Patient's labs show hemoglobin of 10.3.  She is currently on her menstrual cycle but hemodynamically stable.  Normal electrolytes, glucose.  Troponin x 2 negative.  Normal TSH.  Negative D-dimer -doubt PE or DVT.  Urine shows no sign of infection just a small amount of red blood cells likely from her menstrual cycle.  Pregnancy test is negative.  COVID, flu, RSV negative.  Chest x-ray reviewed and interpreted by myself and the radiologist and is clear.  Patient reports she is  feeling back to her baseline.  Unclear etiology for her symptoms but no signs of any life-threatening process present.  Have recommended close follow-up with her primary care doctor.  She is comfortable with this plan.   At this time, I do not feel there is any life-threatening condition present. I reviewed all nursing notes, vitals, pertinent previous records.  All lab and urine results, EKGs, imaging ordered have been independently reviewed and interpreted by myself.  I reviewed all available radiology reports from any imaging ordered this visit.  Based on my assessment, I feel the patient is safe to be discharged home without further emergent workup and can continue workup as an outpatient as needed. Discussed all findings, treatment plan as well as usual and customary return precautions.  They verbalize understanding and are comfortable with this plan.  Outpatient follow-up has been provided as needed.  All questions have been answered.    CONSULTS:  none   OUTSIDE RECORDS REVIEWED: Reviewed previous internal medicine and neurology notes.       FINAL CLINICAL IMPRESSION(S) / ED DIAGNOSES   Final diagnoses:  Shortness of breath  Pain in both lower extremities     Rx / DC Orders   ED Discharge Orders     None        Note:  This document was prepared using Dragon voice recognition software and may  include unintentional dictation errors.   Kamarrion Stfort, Josette SAILOR, DO 05/06/24 [phone]  "

## 2024-05-07 ENCOUNTER — Telehealth: Payer: Self-pay

## 2024-05-07 NOTE — Transitions of Care (Post Inpatient/ED Visit) (Signed)
" ° °  05/07/2024  Name: Tara Gardner MRN: [id number] DOB: December 10, 1970  Today's TOC FU Call Status: Today's TOC FU Call Status:: Unsuccessful Call (1st Attempt) Unsuccessful Call (1st Attempt) Date: 05/07/24  Attempted to reach the patient regarding the most recent Inpatient/ED visit.  Follow Up Plan: Additional outreach attempts will be made to reach the patient to complete the Transitions of Care (Post Inpatient/ED visit) call.   Signature Suzen Shove   CMA II  "

## 2024-05-11 ENCOUNTER — Telehealth: Payer: Self-pay

## 2024-05-11 NOTE — Transitions of Care (Post Inpatient/ED Visit) (Signed)
" ° °  05/11/2024  Name: Tara Gardner MRN: [id number] DOB: 1970/09/26  Today's TOC FU Call Status: Today's TOC FU Call Status:: Unsuccessful Call (2nd Attempt) Unsuccessful Call (2nd Attempt) Date: 05/11/24  Attempted to reach the patient regarding the most recent Inpatient/ED visit.  Follow Up Plan: Additional outreach attempts will be made to reach the patient to complete the Transitions of Care (Post Inpatient/ED visit) call.   Signature Geroge Hahn, CMA   "

## 2024-05-12 ENCOUNTER — Telehealth: Payer: Self-pay

## 2024-05-12 NOTE — Transitions of Care (Post Inpatient/ED Visit) (Signed)
" ° °  05/12/2024  Name: AKAISHA TRUMAN MRN: [id number] DOB: 02-Dec-1970  Today's TOC FU Call Status: Today's TOC FU Call Status:: Unsuccessful Call (3rd Attempt) Unsuccessful Call (3rd Attempt) Date: 05/12/24  Attempted to reach the patient regarding the most recent Inpatient/ED visit.  Follow Up Plan: No further outreach attempts will be made at this time. We have been unable to contact the patient.  Signature Geroge Hahn, CMA   "

## 2025-05-11 ENCOUNTER — Telehealth: Admitting: Neurology
